# Patient Record
Sex: Female | Born: 1975 | Race: White | Hispanic: No | Smoking: Never smoker
Health system: Southern US, Community
[De-identification: ages and names within clinical notes are randomized; demographics above are authoritative.]

## PROBLEM LIST (undated history)

## (undated) DIAGNOSIS — G43909 Migraine, unspecified, not intractable, without status migrainosus: Secondary | ICD-10-CM

## (undated) DIAGNOSIS — K219 Gastro-esophageal reflux disease without esophagitis: Secondary | ICD-10-CM

## (undated) DIAGNOSIS — N879 Dysplasia of cervix uteri, unspecified: Secondary | ICD-10-CM

## (undated) DIAGNOSIS — F329 Major depressive disorder, single episode, unspecified: Secondary | ICD-10-CM

## (undated) DIAGNOSIS — B977 Papillomavirus as the cause of diseases classified elsewhere: Secondary | ICD-10-CM

## (undated) DIAGNOSIS — E039 Hypothyroidism, unspecified: Secondary | ICD-10-CM

## (undated) DIAGNOSIS — F419 Anxiety disorder, unspecified: Secondary | ICD-10-CM

## (undated) DIAGNOSIS — F32A Depression, unspecified: Secondary | ICD-10-CM

## (undated) DIAGNOSIS — J302 Other seasonal allergic rhinitis: Secondary | ICD-10-CM

## (undated) HISTORY — PX: DILATION AND CURETTAGE OF UTERUS: SHX78

## (undated) HISTORY — PX: ABDOMINAL HYSTERECTOMY: SHX81

## (undated) SURGERY — Surgical Case
Anesthesia: *Unknown

---
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
## 2001-08-01 ENCOUNTER — Other Ambulatory Visit: Admission: RE | Admit: 2001-08-01 | Discharge: 2001-08-01 | Payer: Self-pay | Admitting: *Deleted

## 2002-11-27 ENCOUNTER — Other Ambulatory Visit: Admission: RE | Admit: 2002-11-27 | Discharge: 2002-11-27 | Payer: Self-pay | Admitting: Obstetrics and Gynecology

## 2003-11-19 ENCOUNTER — Ambulatory Visit (HOSPITAL_COMMUNITY): Admission: RE | Admit: 2003-11-19 | Discharge: 2003-11-19 | Payer: Self-pay | Admitting: Obstetrics and Gynecology

## 2003-11-19 ENCOUNTER — Encounter (INDEPENDENT_AMBULATORY_CARE_PROVIDER_SITE_OTHER): Payer: Self-pay | Admitting: *Deleted

## 2004-11-03 ENCOUNTER — Other Ambulatory Visit: Admission: RE | Admit: 2004-11-03 | Discharge: 2004-11-03 | Payer: Self-pay | Admitting: Obstetrics and Gynecology

## 2004-12-09 ENCOUNTER — Emergency Department (HOSPITAL_COMMUNITY): Admission: EM | Admit: 2004-12-09 | Discharge: 2004-12-09 | Payer: Self-pay

## 2005-02-04 ENCOUNTER — Inpatient Hospital Stay (HOSPITAL_COMMUNITY): Admission: AD | Admit: 2005-02-04 | Discharge: 2005-02-04 | Payer: Self-pay | Admitting: Obstetrics and Gynecology

## 2005-02-06 ENCOUNTER — Inpatient Hospital Stay (HOSPITAL_COMMUNITY): Admission: AD | Admit: 2005-02-06 | Discharge: 2005-02-06 | Payer: Self-pay | Admitting: Obstetrics and Gynecology

## 2005-02-17 ENCOUNTER — Inpatient Hospital Stay (HOSPITAL_COMMUNITY): Admission: AD | Admit: 2005-02-17 | Discharge: 2005-02-17 | Payer: Self-pay | Admitting: Obstetrics and Gynecology

## 2005-04-13 ENCOUNTER — Inpatient Hospital Stay (HOSPITAL_COMMUNITY): Admission: AD | Admit: 2005-04-13 | Discharge: 2005-04-13 | Payer: Self-pay | Admitting: Obstetrics and Gynecology

## 2005-04-20 ENCOUNTER — Inpatient Hospital Stay (HOSPITAL_COMMUNITY): Admission: AD | Admit: 2005-04-20 | Discharge: 2005-04-20 | Payer: Self-pay | Admitting: Obstetrics and Gynecology

## 2005-04-22 ENCOUNTER — Inpatient Hospital Stay (HOSPITAL_COMMUNITY): Admission: AD | Admit: 2005-04-22 | Discharge: 2005-04-22 | Payer: Self-pay | Admitting: Obstetrics and Gynecology

## 2005-05-04 ENCOUNTER — Inpatient Hospital Stay (HOSPITAL_COMMUNITY): Admission: AD | Admit: 2005-05-04 | Discharge: 2005-05-04 | Payer: Self-pay | Admitting: Obstetrics and Gynecology

## 2005-05-05 ENCOUNTER — Inpatient Hospital Stay (HOSPITAL_COMMUNITY): Admission: AD | Admit: 2005-05-05 | Discharge: 2005-05-08 | Payer: Self-pay | Admitting: Obstetrics & Gynecology

## 2005-06-21 ENCOUNTER — Other Ambulatory Visit: Admission: RE | Admit: 2005-06-21 | Discharge: 2005-06-21 | Payer: Self-pay | Admitting: Obstetrics and Gynecology

## 2007-01-18 ENCOUNTER — Encounter: Admission: RE | Admit: 2007-01-18 | Discharge: 2007-01-18 | Payer: Self-pay | Admitting: Obstetrics and Gynecology

## 2007-06-22 ENCOUNTER — Ambulatory Visit (HOSPITAL_COMMUNITY): Admission: RE | Admit: 2007-06-22 | Discharge: 2007-06-22 | Payer: Self-pay | Admitting: Internal Medicine

## 2007-06-22 ENCOUNTER — Encounter (HOSPITAL_COMMUNITY): Payer: Self-pay | Admitting: Obstetrics and Gynecology

## 2011-04-27 NOTE — Op Note (Signed)
Virginia Marquez, Virginia Marquez                ACCOUNT NO.:  [id number]   MEDICAL RECORD NO.:  [id number]          PATIENT TYPE:  AMB   LOCATION:  [facility]                          FACILITY:  [facility]   PHYSICIAN:  Zelphia Cairo, MD    DATE OF BIRTH:  04/06/1976   DATE OF PROCEDURE:  06/22/2007  DATE OF DISCHARGE:  06/22/2007                               OPERATIVE REPORT   PREOPERATIVE DIAGNOSIS:  Missed AB   POSTOPERATIVE DIAGNOSIS:  Missed AB.   PROCEDURE:  1. Suction D&E.  2. Cervical block.   ANESTHESIA:  MAC and local.   SPECIMEN:  Products of conception.   ESTIMATED BLOOD LOSS:  Minimal.   COMPLICATIONS:  None.   CONDITION:  Stable and extubated to recovery room.   PROCEDURE:  The patient was taken to the operating room where anesthesia  was adequately obtained. She was placed in the dorsal lithotomy position  using Allen stirrups.  She was prepped and draped in sterile fashion and  an in-and-out catheter was used to drain her bladder.  A bivalve  speculum was then placed in the vagina and a single-tooth tenaculum was  placed on the anterior lip of the cervix.  The cervical block was  performed.  The cervix was gently dilated and a suction catheter was  inserted without difficulty to the fundus.  Suction was turned on and  the contents of the uterus were evacuated.  The curette was then used to  ensure a uterine cry.  Hemostasis was assured.  The single-tooth  tenaculum and speculum were removed from the vagina.  The patient was  taken to the recovery room in stable condition.      Zelphia Cairo, MD  Electronically Signed     GA/MEDQ  D:  07/25/2007  T:  07/26/2007  Job:  [id number]

## 2011-04-30 NOTE — Op Note (Signed)
NAME:  Virginia Marquez, Virginia Marquez                          ACCOUNT NO.:  [id number]   MEDICAL RECORD NO.:  [id number]                   PATIENT TYPE:  AMB   LOCATION:  [facility]                                  [facility]ILITY:  [facility]   PHYSICIAN:  Michelle L. Vincente Poli, M.D.            DATE OF BIRTH:  1976/01/21   DATE OF PROCEDURE:  11/19/2003  DATE OF DISCHARGE:                                 OPERATIVE REPORT   PREOPERATIVE DIAGNOSIS:  Missed abortion.   POSTOPERATIVE DIAGNOSIS:  Missed abortion.   PROCEDURE:  Dilatation and evacuation.   SURGEON:  Michelle L. Vincente Poli, M.D.   ANESTHESIA:  MAC paracervical block.   PATHOLOGY:  Products of conception.   DESCRIPTION OF PROCEDURE:  The patient was taken to the operating room.  She  was then given sedation, placed in the lithotomy position.  She was then  prepped and draped in the usual sterile fashion.  In and out catheter was  used to empty the bladder.  Speculum was inserted into the vagina.  The  cervix was grasped with a tenaculum and a paracervical block was performed  in standard fashion at 5 o'clock.  The uterus sounded to 8 cm and was noted  to be in the midline.  The cervical internal os was gently dilated using  Pratt dilators and #7 suction cannula was inserted without difficulty and  the suction curettage was performed with retrieval of the contents  consistent grossly with products of conception.  The suction curettage was  removed and sharp curette was inserted and all four walls of the uterus was  thoroughly curetted until there was no tissue remaining.  A final suction  curettage was then performed.  A substantial amount of tissue was then sent  to [facility] Blase for chromosome analysis.  All instruments were removed from  the vagina.  There was no vaginal bleeding noted.  The patient was taken to  the recovery room in stable condition.  Sponge, lap and instrument counts  correct x 2.                                               Michelle L.  Vincente Poli, M.D.    Florestine Avers  D:  11/19/2003  T:  11/20/2003  Job:  [id number]

## 2011-04-30 NOTE — H&P (Signed)
NAMEELLAGRACE, YOSHIDA                ACCOUNT NO.:  [id number]   MEDICAL RECORD NO.:  [id number]          PATIENT TYPE:  EMS   LOCATION:  ED                           FACILITY:  [facility]   PHYSICIAN:  Sandria Bales. Ezzard Standing, M.D.  DATE OF BIRTH:  27-Jul-1976   DATE OF ADMISSION:  12/09/2004  DATE OF DISCHARGE:                                HISTORY & PHYSICAL   HISTORY:  This is a 35 year old white female who is [redacted] weeks pregnant.  She  apparently had 1 miscarriage before.  She had an amniocentesis today, has  had some kind of vague, right-sided abdominal pain for about 1 week's time.  At the time of amniocentesis, the ultrasound of her gallbladder was negative  by Dr. Vincente Poli.   I spoke with Dr. Orson Slick.  There is still a question of whether she has  gallbladder disease.  She came to [facility] for ultrasound  evaluation.   ALLERGIES:  The patient is allergic to SULFA.   MEDICATIONS:  Include prenatal vitamins.   PHYSICAL EXAMINATION:  VITAL SIGNS:  Temperature 99, blood pressure 138/76,  pulse 87, respirations 20.  GENERAL:  She is a well-nourished, pleasant white female, alert and  cooperative.  ABDOMEN:  Her abdomen is soft.  She has a full uterus which can be palpated.  She has no tenderness, guarding or rebound, normal bowel sounds.  Really a  totally benign abdomen.   I reviewed her ultrasound with Dr. Molli Posey.  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   IMPRESSION:  Ms. Reader has some right-sided abdominal pain of unclear  etiology, but it does not appear to be surgical.  I do not think she has  gall bladder disease.  She is pregnant and this may have something to do  with her pain.   We referred her back to Dr. Vincente Poli, and she needs no follow up with [place]  unless there are further questions or problems.  Of note, I do not have any  labs on her.  These  were done at Dr. Lynnell Dike office and the patient did not  bring them to the ER.     Davi   DHN/MEDQ  D:  12/09/2004  T:  12/09/2004  Job:  [id number]   cc:   Marcelino Duster L. Vincente Poli, M.D.  [address], [address]  [place] [address]  Fax: [phone]

## 2011-09-28 LAB — CBC
HCT: 33.6 — ABNORMAL LOW
Hemoglobin: 11.5 — ABNORMAL LOW
MCHC: 34.3
MCV: 91.1
Platelets: 234
RBC: 3.69 — ABNORMAL LOW
RDW: 12.4
WBC: 8.2

## 2016-11-22 ENCOUNTER — Ambulatory Visit (INDEPENDENT_AMBULATORY_CARE_PROVIDER_SITE_OTHER): Payer: Commercial Managed Care - HMO

## 2016-11-22 ENCOUNTER — Ambulatory Visit (INDEPENDENT_AMBULATORY_CARE_PROVIDER_SITE_OTHER): Payer: Commercial Managed Care - HMO | Admitting: Orthopaedic Surgery

## 2016-11-22 DIAGNOSIS — M25512 Pain in left shoulder: Secondary | ICD-10-CM | POA: Diagnosis not present

## 2016-11-22 DIAGNOSIS — M25521 Pain in right elbow: Secondary | ICD-10-CM | POA: Diagnosis not present

## 2016-11-22 MED ORDER — DICLOFENAC SODIUM 1 % TD GEL: 2.0000 g | Freq: Four times a day (QID) | TRANSDERMAL | 3 refills | Status: DC

## 2016-11-22 MED ORDER — METHYLPREDNISOLONE 4 MG PO TABS: ORAL_TABLET | ORAL | 0 refills | Status: DC

## 2016-11-22 NOTE — Progress Notes (Signed)
Office Visit Note   Patient: Virginia Marquez           Date of Birth: [birth year]           MRN: [id number] Visit Date: 11/22/2016              Requested by: No referring provider defined for this encounter. PCP: [facility]   Assessment & Plan: Visit Diagnoses:  1. Acute pain of left shoulder   2. Pain in right elbow     Plan: At this point I want her modifying her exercise routine completely. I will send in a steroid taper as well as topical anti-inflammatory to try. My next step would be an injection in both areas of steroid if this does not get better. She'll let us know.  Follow-Up Instructions: Return if symptoms worsen or fail to improve.   Orders:  Orders Placed This Encounter  Procedures  . XR Shoulder Left  . XR Elbow 2 Views Right   Meds ordered this encounter  Medications  . methylPREDNISolone (MEDROL) 4 MG tablet    Sig: Medrol dose pack. Take as instructed    Dispense:  21 tablet    Refill:  0  . diclofenac sodium (VOLTAREN) 1 % GEL    Sig: Apply 2 g topically 4 (four) times daily.    Dispense:  100 g    Refill:  3      Procedures: No procedures performed   Clinical Data: No additional findings.   Subjective: Chief Complaint  Patient presents with  . Left Shoulder - Pain    Patient states shoulder for about 2 months, the elbow for about 4 months. She does a lot of lifting at the gym.shoulder painful to touch or reach. Elbow pretty constant and tender to touch.   . Right Elbow - Pain   She said her main pain is occurred after working out. She does a lot of exercise routines is involves her shoulder on the left side at her elbow on the right side. This is mainly curls but also bench pressing as well. She's tried activity modification and has not helped. She's not tried any anti-inflammatories at all. HPI  Review of Systems She denies any neck pain, headache, chest pain, shortness of breath, fever, chills, nausea,  vomiting.  Objective: Vital Signs: There were no vitals taken for this visit.  Physical Exam She is alert and oriented 3 in no acute distress Ortho Exam Examination of her right elbow shows full range of motion of the elbow. She has pain over the medial epicondyle. She has a negative Tinel's test at the cubital tunnel. Her rotation is also full the elbow. Examination of her left shoulder shows full range of motion of the left shoulder. Her pain is at the acromioclavicular joint and some in the subacromial outlet. Her motion is full. Her pain is mainly with internal rotation and adduction as well as a positive Neer sign. Her rotator cuff strength is 5 out of 5. Specialty Comments:  No specialty comments available.  Imaging: Xr Elbow 2 Views Right  Result Date: 11/22/2016 An AP and lateral of her right elbow show no acute irregularities of the bone or joint. There is no effusion.  Xr Shoulder Left  Result Date: 11/22/2016 3 views of her left shoulder AP outlet and axillary show well located shoulder. 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    PMFS History: There are no  active problems to display for this patient.  No past medical history on file.  No family history on file.  No past surgical history on file. Social History   Occupational History  . Not on file.   Social History Main Topics  . Smoking status: Not on file  . Smokeless tobacco: Not on file  . Alcohol use Not on file  . Drug use: Unknown  . Sexual activity: Not on file

## 2017-03-29 DIAGNOSIS — J309 Allergic rhinitis, unspecified: Secondary | ICD-10-CM | POA: Insufficient documentation

## 2017-03-29 DIAGNOSIS — Z86010 Personal history of colonic polyps: Secondary | ICD-10-CM | POA: Insufficient documentation

## 2017-03-29 DIAGNOSIS — R4589 Other symptoms and signs involving emotional state: Secondary | ICD-10-CM | POA: Insufficient documentation

## 2017-03-29 DIAGNOSIS — K219 Gastro-esophageal reflux disease without esophagitis: Secondary | ICD-10-CM | POA: Insufficient documentation

## 2017-05-24 DIAGNOSIS — E039 Hypothyroidism, unspecified: Secondary | ICD-10-CM | POA: Insufficient documentation

## 2017-08-01 DIAGNOSIS — G44209 Tension-type headache, unspecified, not intractable: Secondary | ICD-10-CM | POA: Insufficient documentation

## 2018-10-02 ENCOUNTER — Encounter (INDEPENDENT_AMBULATORY_CARE_PROVIDER_SITE_OTHER): Payer: Self-pay | Admitting: Orthopaedic Surgery

## 2018-10-02 ENCOUNTER — Ambulatory Visit (INDEPENDENT_AMBULATORY_CARE_PROVIDER_SITE_OTHER): Payer: Self-pay

## 2018-10-02 ENCOUNTER — Ambulatory Visit (INDEPENDENT_AMBULATORY_CARE_PROVIDER_SITE_OTHER): Payer: 59 | Admitting: Orthopaedic Surgery

## 2018-10-02 DIAGNOSIS — M79671 Pain in right foot: Secondary | ICD-10-CM | POA: Diagnosis not present

## 2018-10-02 DIAGNOSIS — M7702 Medial epicondylitis, left elbow: Secondary | ICD-10-CM | POA: Diagnosis not present

## 2018-10-02 MED ORDER — METHYLPREDNISOLONE ACETATE 40 MG/ML IJ SUSP
40.0000 mg | INTRAMUSCULAR | Status: AC | PRN
  Administered 2018-10-02: 40 mg

## 2018-10-02 MED ORDER — LIDOCAINE HCL 1 % IJ SOLN
1.0000 mL | INTRAMUSCULAR | Status: AC | PRN
Start: 2018-10-02 — End: 2018-10-02
  Administered 2018-10-02: 1 mL

## 2018-10-02 NOTE — Progress Notes (Signed)
Office Visit Note   Patient: Virginia Marquez           Date of Birth: December 06, 1976           MRN: [id number] Visit Date: 10/02/2018              Requested by: [facility] [address] [place], [place] [address] PCP: [facility]   Assessment & Plan: Visit Diagnoses:  1. Pain in right foot   2. Medial epicondylitis of left elbow     Plan: I do feel that she benefit from injections in both areas as does she.  She will work on stretching exercises as well.  I explained the rationale risk and benefits behind injections and she agreed to proceed with these.  All question concerns were answered and addressed.  She tolerated the injections well.  Follow-up will be as needed.  However if it gets to where it is been 6 weeks I can always repeat these injections.  She will call and let us know.  She may modify her shoe wear as well.  Follow-Up Instructions: Return if symptoms worsen or fail to improve.   Orders:  Orders Placed This Encounter  Procedures  . Hand/UE Inj  . XR Foot Complete Right   No orders of the defined types were placed in this encounter.     Procedures: Hand/UE Inj: L elbow for medial epicondylitis on 10/02/2018 9:20 AM Medications: 1 mL lidocaine 1 %; 40 mg methylPREDNISolone acetate 40 MG/ML      Clinical Data: No additional findings.   Subjective: Chief Complaint  Patient presents with  . Left Elbow - Pain  The patient comes today with chief complaint of left elbow pain over the medial epicondylar area as well as right foot pain over the sinus tarsus area.  Neither side as she injured acutely.  However the right foot pain came acutely as well as left elbow pain.  She has a lot of physical high impact activities.  She is avidly working out as well.  She denies any numbness tingling in her hand or foot.  She denies any previous injuries to these areas.  She is had epicondylitis but on the right side before.  HPI  Review of  Systems She currently denies any headache, chest pain, shortness of breath, fever, chills, nausea, vomiting.  Objective: Vital Signs: There were no vitals taken for this visit.  Physical Exam She is alert and oriented x3 and in no acute distress Ortho Exam Examination of her left elbow shows pain over the medial epicondyle area of the elbow.  The elbow exam is otherwise entirely normal.  Examination of her right foot shows pain over the sinus tarsus area but otherwise normal exam. Specialty Comments:  No specialty comments available.  Imaging: Xr Foot Complete Right  Result Date: 10/02/2018 3 views of the right foot show no acute findings.    PMFS History: Patient Active Problem List   Diagnosis Date Noted  . Medial epicondylitis of left elbow 10/02/2018   History reviewed. No pertinent past medical history.  History reviewed. No pertinent family history.  History reviewed. No pertinent surgical history. Social History   Occupational History  . Not on file  Tobacco Use  . Smoking status: Not on file  Substance and Sexual Activity  . Alcohol use: Not on file  . Drug use: Not on file  . Sexual activity: Not on file

## 2018-12-26 ENCOUNTER — Encounter (INDEPENDENT_AMBULATORY_CARE_PROVIDER_SITE_OTHER): Payer: Self-pay | Admitting: Orthopaedic Surgery

## 2018-12-26 ENCOUNTER — Ambulatory Visit (INDEPENDENT_AMBULATORY_CARE_PROVIDER_SITE_OTHER): Payer: 59 | Admitting: Orthopaedic Surgery

## 2018-12-26 DIAGNOSIS — M7702 Medial epicondylitis, left elbow: Secondary | ICD-10-CM | POA: Diagnosis not present

## 2018-12-26 MED ORDER — LIDOCAINE HCL 1 % IJ SOLN
1.0000 mL | INTRAMUSCULAR | Status: AC | PRN
  Administered 2018-12-26: 1 mL

## 2018-12-26 MED ORDER — METHYLPREDNISOLONE ACETATE 40 MG/ML IJ SUSP
40.0000 mg | INTRAMUSCULAR | Status: AC | PRN
  Administered 2018-12-26: 40 mg

## 2018-12-26 NOTE — Progress Notes (Signed)
   Office Visit Note   Patient: Virginia Marquez           Date of Birth: 06-08-1976           MRN: [id number] Visit Date: 12/26/2018              Requested by: [facility] [address] [place], [place] [address] PCP: [facility]   Assessment & Plan: Visit Diagnoses:  1. Medial epicondylitis of left elbow     Plan: Her signs and symptoms are consistent with medial epicondylitis of the left elbow.  She is had an injection before and I agree with trying another one today.  She tolerated it well.  All question concerns were answered and addressed.  Follow-up otherwise be as needed.  Follow-Up Instructions: Return if symptoms worsen or fail to improve.   Orders:  Orders Placed This Encounter  Procedures  . Hand/UE Inj   No orders of the defined types were placed in this encounter.     Procedures: Hand/UE Inj: L elbow for medial epicondylitis on 12/26/2018 10:16 AM Medications: 1 mL lidocaine 1 %; 40 mg methylPREDNISolone acetate 40 MG/ML      Clinical Data: No additional findings.   Subjective: Chief Complaint  Patient presents with  . Left Elbow - Follow-up  The patient is someone not seen before for left elbow medial epicondylitis.  This is back in October of last year.  We provided injection around the area there is most tender and she is done well.  She is had a flareup of pain again and she points the medial epicondyles source of her pain.  She denies any numbness and tingling or weakness in her left hand.  She is not sure if there is repetitive activities causing this but she is having the same pain again.  The injection did well for her before so she would like to consider an injection again in the left elbow medial epicondylar area.  She denies any acute change in her medical status and is otherwise a healthy 43 year old female.  HPI  Review of Systems She currently denies any headache, chest pain, shortness of breath, fever, chills,  nausea, vomiting.  Objective: Vital Signs: There were no vitals taken for this visit.  Physical Exam She is alert and orient x3 and in no acute distress Ortho Exam Examination of her left elbow shows pain over the medial epicondyle.  She has a negative Tinel sign at the cubital tunnel.  She has full elbow motion and her left elbow is stable on exam.  Her hand exam is normal. Specialty Comments:  No specialty comments available.  Imaging: No results found.   PMFS History: Patient Active Problem List   Diagnosis Date Noted  . Medial epicondylitis of left elbow 10/02/2018   History reviewed. No pertinent past medical history.  History reviewed. No pertinent family history.  History reviewed. No pertinent surgical history. Social History   Occupational History  . Not on file  Tobacco Use  . Smoking status: Not on file  Substance and Sexual Activity  . Alcohol use: Not on file  . Drug use: Not on file  . Sexual activity: Not on file

## 2019-04-24 ENCOUNTER — Other Ambulatory Visit: Payer: Self-pay

## 2019-04-24 ENCOUNTER — Ambulatory Visit (INDEPENDENT_AMBULATORY_CARE_PROVIDER_SITE_OTHER): Payer: 59 | Admitting: Podiatry

## 2019-04-24 ENCOUNTER — Encounter: Payer: Self-pay | Admitting: Podiatry

## 2019-04-24 ENCOUNTER — Ambulatory Visit (INDEPENDENT_AMBULATORY_CARE_PROVIDER_SITE_OTHER): Payer: 59

## 2019-04-24 VITALS — Temp 96.8°F

## 2019-04-24 DIAGNOSIS — M722 Plantar fascial fibromatosis: Secondary | ICD-10-CM | POA: Diagnosis not present

## 2019-04-24 MED ORDER — MELOXICAM 15 MG PO TABS: 15.0000 mg | ORAL_TABLET | Freq: Every day | ORAL | 3 refills | Status: DC

## 2019-04-24 MED ORDER — METHYLPREDNISOLONE 4 MG PO TBPK: ORAL_TABLET | ORAL | 0 refills | Status: DC

## 2019-04-24 NOTE — Patient Instructions (Signed)

## 2019-04-25 ENCOUNTER — Encounter: Payer: Self-pay | Admitting: Podiatry

## 2019-04-25 NOTE — Progress Notes (Signed)
Subjective:  Patient ID: Virginia Marquez, female    DOB: Nov 30, 1976,  MRN: [id number] HPI Chief Complaint  Patient presents with   Foot Pain    Plantar/medial heel left - aching x few weeks, severity increased last Monday, constant pain whenever weight bearing, tried PF sock and a massage ball-no help   New Patient (Initial Visit)    43 y.o. female presents with the above complaint.   ROS: Denies fever chills nausea vomiting muscle aches pains calf pain back pain chest pain shortness of breath.  No past medical history on file. No past surgical history on file.  Current Outpatient Medications:    cetirizine (ZYRTEC) 10 MG tablet, Take 10 mg by mouth daily., Disp: , Rfl:    citalopram (CELEXA) 20 MG tablet, TAKE 1 TABLET(S) EVERY DAY BY ORAL ROUTE., Disp: , Rfl: 11   levonorgestrel (MIRENA) 20 MCG/24HR IUD, by Intrauterine route., Disp: , Rfl:    levothyroxine (SYNTHROID) 25 MCG tablet, Take 25 mcg by mouth daily., Disp: , Rfl:    meloxicam (MOBIC) 15 MG tablet, Take 1 tablet (15 mg total) by mouth daily., Disp: 30 tablet, Rfl: 3   methylPREDNISolone (MEDROL DOSEPAK) 4 MG TBPK tablet, 6 day dose pack - take as directed, Disp: 21 tablet, Rfl: 0   pantoprazole (PROTONIX) 40 MG tablet, TAKE 1 TABLET(S) EVERY DAY BY ORAL ROUTE., Disp: , Rfl: 11  Allergies  Allergen Reactions   Sulfa Antibiotics Other (See Comments)   Review of Systems Objective:   Vitals:   04/24/19 1445  Temp: (!) 96.8 F (36 C)    General: Well developed, nourished, in no acute distress, alert and oriented x3   Dermatological: Skin is warm, dry and supple bilateral. Nails x 10 are well maintained; remaining integument appears unremarkable at this time. There are no open sores, no preulcerative lesions, no rash or signs of infection present.  Vascular: Dorsalis Pedis artery and Posterior Tibial artery pedal pulses are 2/4 bilateral with immedate capillary fill time. Pedal hair growth present. No  varicosities and no lower extremity edema present bilateral.   Neruologic: Grossly intact via light touch bilateral. Vibratory intact via tuning fork bilateral. Protective threshold with Semmes Wienstein monofilament intact to all pedal sites bilateral. Patellar and Achilles deep tendon reflexes 2+ bilateral. No Babinski or clonus noted bilateral.   Musculoskeletal: No gross boney pedal deformities bilateral. No pain, crepitus, or limitation noted with foot and ankle range of motion bilateral. Muscular strength 5/5 in all groups tested bilateral.  She has pain on direct palpation medial calcaneal tubercle of her left heel no pain on medial and lateral compression of the calcaneus.  Gait: Unassisted, Nonantalgic.    Radiographs:  Radiographs taken today demonstrates soft tissue swelling along the medial heel at the plantar fascial calcaneal insertion site.  Small plantar distally oriented calcaneal heel spurs noted no other acute findings are noted.  Assessment & Plan:   Assessment: Plantar fasciitis.  Left.  Plan: Discussed etiology pathology conservative versus surgical therapies.  At this point I injected her left heel with the 20 mg of Kenalog 5 mg Marcaine after sterile Betadine skin prep medial aspect of the heel at the plantar fashion calcaneal insertion site which she tolerated well.  Also started on a Medrol Dosepak to be followed by meloxicam placed in a plantar fascial brace to be followed by a night splint.  We discussed appropriate shoe gear stretching exercises ice therapy and shoe gear modifications and reduction in overall general activities.  I would like to follow-up with her in 1 month for reevaluation.  May need to consider orthotics for her simply because she is started to develop some tenderness along the medial ankle and orthotics may be necessary for her high activity level.     Virginia Marquez, [place]

## 2019-05-18 ENCOUNTER — Other Ambulatory Visit: Payer: Self-pay

## 2019-05-21 ENCOUNTER — Ambulatory Visit: Payer: 59 | Admitting: Obstetrics & Gynecology

## 2019-05-21 ENCOUNTER — Encounter: Payer: Self-pay | Admitting: Obstetrics & Gynecology

## 2019-05-21 ENCOUNTER — Other Ambulatory Visit: Payer: Self-pay

## 2019-05-21 VITALS — BP 118/74 | Ht 68.0 in | Wt 181.0 lb

## 2019-05-21 DIAGNOSIS — Z30431 Encounter for routine checking of intrauterine contraceptive device: Secondary | ICD-10-CM

## 2019-05-21 DIAGNOSIS — Z7251 High risk heterosexual behavior: Secondary | ICD-10-CM | POA: Diagnosis not present

## 2019-05-21 DIAGNOSIS — Z113 Encounter for screening for infections with a predominantly sexual mode of transmission: Secondary | ICD-10-CM | POA: Diagnosis not present

## 2019-05-21 DIAGNOSIS — R300 Dysuria: Secondary | ICD-10-CM

## 2019-05-21 LAB — PREGNANCY, URINE: Preg Test, Ur: NEGATIVE

## 2019-05-21 LAB — WET PREP FOR TRICH, YEAST, CLUE

## 2019-05-21 NOTE — Progress Notes (Signed)
    Virginia Marquez [birth year] [id number]        43 y.o.  [id number]  Single  RP: Pelvic pain/pressure with dysuria x about 3 weeks  HPI: On Mirena IUD x 2016.  IC 5/9th with new partner, random.  Sxs started after that.  Tends to have UTIs after IC, so treated with Cipro and Furantoin.  Still having dysuria and low back pain.  No increased vaginal discharge.  No BTB.  No fever.   OB History  Gravida Para Term Preterm AB Living  4 2     2 2   SAB TAB Ectopic Multiple Live Births  2            # Outcome Date GA Lbr Len/2nd Weight Sex Delivery Anes PTL Lv  4 SAB           3 SAB           2 Para           1 Para             Past medical history,surgical history, problem list, medications, allergies, family history and social history were all reviewed and documented in the EPIC chart.   Directed ROS with pertinent positives and negatives documented in the history of present illness/assessment and plan.  Exam:  Vitals:   05/21/19 1218  BP: 118/74  Weight: 181 lb (82.1 kg)  Height: 5\' 8"  (1.727 m)   General appearance:  Normal  Abdomen: Normal  CVAT negative bilaterally  Gynecologic exam: Vulva normal.  Speculum:  Cervix normal, IUD strings visible at EO.  Vagina normal.  Normal vaginal secretions.  Bimanual exam:  Uterus AV, mobile, NT, normal size.  No adnexal mass felt, NT.  U/A completely negative  Wet prep negative  UPT negative   Assessment/Plan:  43 y.o. [id number]   1. Dysuria Urine analysis completely negative.  Patient reassured. - Urinalysis,Complete w/RFL Culture  2. Unprotected sexual intercourse Strict condom use strongly recommended.  Urine pregnancy test negative.  Pending STI screening. - Pregnancy, urine  3. Screen for STD (sexually transmitted disease) Strict condom use strongly recommended. - HIV antibody (with reflex) - RPR - Hepatitis C Antibody - Hepatitis B Surface AntiGEN - C. trachomatis/N. gonorrhoeae RNA - WET PREP FOR TRICH, YEAST,  CLUE  4. Encounter for routine checking of intrauterine contraceptive device (IUD) Mirena IUD inserted in 2016.  Well-tolerated and in good position.  Counseling on above issues and coordination of care more than 50% for 30 minutes.  Princess Bruins MD, 12:37 PM 05/21/2019

## 2019-05-21 NOTE — Patient Instructions (Signed)
  1. Dysuria Urine analysis completely negative.  Patient reassured. - Urinalysis,Complete w/RFL Culture  2. Unprotected sexual intercourse Strict condom use strongly recommended.  Urine pregnancy test negative.  Pending STI screening. - Pregnancy, urine  3. Screen for STD (sexually transmitted disease) Strict condom use strongly recommended. - HIV antibody (with reflex) - RPR - Hepatitis C Antibody - Hepatitis B Surface AntiGEN - C. trachomatis/N. gonorrhoeae RNA - WET PREP FOR TRICH, YEAST, CLUE  4. Encounter for routine checking of intrauterine contraceptive device (IUD) Mirena IUD inserted in 2016.  Well-tolerated and in good position.  Virginia Marquez, it was a pleasure meeting you today!  I will inform you of your results as soon as they are available.

## 2019-05-22 ENCOUNTER — Ambulatory Visit: Payer: 59 | Admitting: Podiatry

## 2019-05-22 LAB — URINALYSIS, COMPLETE W/RFL CULTURE
Bacteria, UA: NONE SEEN /HPF
Bilirubin Urine: NEGATIVE
Glucose, UA: NEGATIVE
Hgb urine dipstick: NEGATIVE
Hyaline Cast: NONE SEEN /LPF
Ketones, ur: NEGATIVE
Leukocyte Esterase: NEGATIVE
Nitrites, Initial: NEGATIVE
Protein, ur: NEGATIVE
RBC / HPF: NONE SEEN /HPF (ref 0–2)
Specific Gravity, Urine: 1.002 (ref 1.001–1.03)
WBC, UA: NONE SEEN /HPF (ref 0–5)
pH: 5.5 (ref 5.0–8.0)

## 2019-05-22 LAB — HEPATITIS B SURFACE ANTIGEN: Hepatitis B Surface Ag: NONREACTIVE

## 2019-05-22 LAB — RPR: RPR Ser Ql: NONREACTIVE

## 2019-05-22 LAB — C. TRACHOMATIS/N. GONORRHOEAE RNA
C. trachomatis RNA, TMA: NOT DETECTED
N. gonorrhoeae RNA, TMA: NOT DETECTED

## 2019-05-22 LAB — HIV ANTIBODY (ROUTINE TESTING W REFLEX): HIV 1&2 Ab, 4th Generation: NONREACTIVE

## 2019-05-22 LAB — HEPATITIS C ANTIBODY
Hepatitis C Ab: NONREACTIVE
SIGNAL TO CUT-OFF: 0.02 (ref <1.00)

## 2019-05-22 LAB — NO CULTURE INDICATED

## 2019-07-30 ENCOUNTER — Other Ambulatory Visit: Payer: Self-pay

## 2019-07-31 ENCOUNTER — Ambulatory Visit (INDEPENDENT_AMBULATORY_CARE_PROVIDER_SITE_OTHER): Payer: 59 | Admitting: Obstetrics & Gynecology

## 2019-07-31 ENCOUNTER — Encounter: Payer: Self-pay | Admitting: Obstetrics & Gynecology

## 2019-07-31 VITALS — BP 126/78 | Ht 68.0 in | Wt 184.0 lb

## 2019-07-31 DIAGNOSIS — Z30431 Encounter for routine checking of intrauterine contraceptive device: Secondary | ICD-10-CM

## 2019-07-31 DIAGNOSIS — Z01419 Encounter for gynecological examination (general) (routine) without abnormal findings: Secondary | ICD-10-CM

## 2019-07-31 NOTE — Progress Notes (Signed)
Virginia Marquez December 02, 1976 [id number]   History:    43 y.o. G4P2A2L2 Single.  Son is 39 and daughter is 10+++.  RP:  Established patient presenting for annual gyn exam   HPI: Well on Mirena IUD x 2016, will verify the exact month of insertion.  Light occasional menses.  No pelvic pain.  Full STI screen negative 05/2019.  Not currently sexually active. Urine/BMs normal.  Breasts normal.  BMI 27.98.  Good fitness and healthy nutrition.  Health labs with [facility]  Past medical history,surgical history, family history and social history were all reviewed and documented in the EPIC chart.  Gynecologic History No LMP recorded. (Menstrual status: IUD). Contraception: Mirena IUD x 2016 Last Pap: 2019. Results were: normal Last mammogram: 2019. Results were: normal Bone Density: Never Colonoscopy: Never  Obstetric History OB History  Gravida Para Term Preterm AB Living  4 2     2 2   SAB TAB Ectopic Multiple Live Births  2            # Outcome Date GA Lbr Len/2nd Weight Sex Delivery Anes PTL Lv  4 SAB           3 SAB           2 Para           1 Para              ROS: A ROS was performed and pertinent positives and negatives are included in the history.  GENERAL: No fevers or chills. HEENT: No change in vision, no earache, sore throat or sinus congestion. NECK: No pain or stiffness. CARDIOVASCULAR: No chest pain or pressure. No palpitations. PULMONARY: No shortness of breath, cough or wheeze. GASTROINTESTINAL: No abdominal pain, nausea, vomiting or diarrhea, melena or bright red blood per rectum. GENITOURINARY: No urinary frequency, urgency, hesitancy or dysuria. MUSCULOSKELETAL: No joint or muscle pain, no back pain, no recent trauma. DERMATOLOGIC: No rash, no itching, no lesions. ENDOCRINE: No polyuria, polydipsia, no heat or cold intolerance. No recent change in weight. HEMATOLOGICAL: No anemia or easy bruising or bleeding. NEUROLOGIC: No headache, seizures, numbness, tingling or  weakness. PSYCHIATRIC: No depression, no loss of interest in normal activity or change in sleep pattern.     Exam:   BP 126/78   Ht 5\' 8"  (1.727 m)   Wt 184 lb (83.5 kg)   BMI 27.98 kg/m   Body mass index is 27.98 kg/m.  General appearance : Well developed well nourished female. No acute distress HEENT: Eyes: no retinal hemorrhage or exudates,  Neck supple, trachea midline, no carotid bruits, no thyroidmegaly Lungs: Clear to auscultation, no rhonchi or wheezes, or rib retractions  Heart: Regular rate and rhythm, no murmurs or gallops Breast:Examined in sitting and supine position were symmetrical in appearance, no palpable masses or tenderness,  no skin retraction, no nipple inversion, no nipple discharge, no skin discoloration, no axillary or supraclavicular lymphadenopathy Abdomen: no palpable masses or tenderness, no rebound or guarding Extremities: no edema or skin discoloration or tenderness  Pelvic: Vulva: Normal             Vagina: No gross lesions or discharge  Cervix: No gross lesions or discharge.  IUD strings visible.  Pap/HPV HR done.  Uterus  AV, normal size, shape and consistency, non-tender and mobile  Adnexa  Without masses or tenderness  Anus: Normal   Assessment/Plan:  43 y.o. female for annual exam   1. Encounter for routine gynecological  examination with Papanicolaou smear of cervix Normal gynecologic exam.  Pap with high-risk HPV done today.  Breast exam normal.  Screening mammogram 2019 was normal per patient.  Will schedule a screening mammogram now.  Health labs with family physician.  Body mass index 27.98.  Recommend a slightly lower calorie/carb diet such as [employer] [employer] diet.  Aerobic physical activities 5 times a week and weightlifting every 2 days.  2. Encounter for routine checking of intrauterine contraceptive device (IUD) Mirena IUD well-tolerated and in good position.  Year of insertion was 2016.  Patient will verify the exact insertion date.  We  will follow-up at 5 years for IUD removal and new IUD insertion.  Genia DelMarie-Lyne Mariena Meares MD, 9:33 AM 07/31/2019

## 2019-08-02 ENCOUNTER — Other Ambulatory Visit: Payer: Self-pay | Admitting: Podiatry

## 2019-08-02 LAB — PAP, TP IMAGING W/ HPV RNA, RFLX HPV TYPE 16,18/45: HPV DNA High Risk: DETECTED — AB

## 2019-08-05 ENCOUNTER — Encounter: Payer: Self-pay | Admitting: Obstetrics & Gynecology

## 2019-08-05 NOTE — Patient Instructions (Signed)
  1. Encounter for routine gynecological examination with Papanicolaou smear of cervix Normal gynecologic exam.  Pap with high-risk HPV done today.  Breast exam normal.  Screening mammogram 2019 was normal per patient.  Will schedule a screening mammogram now.  Health labs with family physician.  Body mass index 27.98.  Recommend a slightly lower calorie/carb diet such as [employer].  Aerobic physical activities 5 times a week and weightlifting every 2 days.  2. Encounter for routine checking of intrauterine contraceptive device (IUD) Mirena IUD well-tolerated and in good position.  Year of insertion was 2016.  Patient will verify the exact insertion date.  We will follow-up at 5 years for IUD removal and new IUD insertion.  Virginia Marquez, it was a pleasure seeing you today!  I will inform you of your results as soon as they are available.

## 2019-08-09 ENCOUNTER — Other Ambulatory Visit: Payer: Self-pay | Admitting: Obstetrics & Gynecology

## 2019-08-09 DIAGNOSIS — Z1231 Encounter for screening mammogram for malignant neoplasm of breast: Secondary | ICD-10-CM

## 2019-08-28 ENCOUNTER — Other Ambulatory Visit: Payer: Self-pay

## 2019-08-29 ENCOUNTER — Ambulatory Visit: Payer: 59 | Admitting: Obstetrics & Gynecology

## 2019-08-29 ENCOUNTER — Encounter: Payer: Self-pay | Admitting: Obstetrics & Gynecology

## 2019-08-29 VITALS — BP 120/72

## 2019-08-29 DIAGNOSIS — Z30431 Encounter for routine checking of intrauterine contraceptive device: Secondary | ICD-10-CM

## 2019-08-29 DIAGNOSIS — R87810 Cervical high risk human papillomavirus (HPV) DNA test positive: Secondary | ICD-10-CM

## 2019-08-29 DIAGNOSIS — N871 Moderate cervical dysplasia: Secondary | ICD-10-CM

## 2019-08-29 DIAGNOSIS — R87612 Low grade squamous intraepithelial lesion on cytologic smear of cervix (LGSIL): Secondary | ICD-10-CM

## 2019-08-29 NOTE — Patient Instructions (Signed)
1. LGSIL on Pap smear of cervix Colposcopy procedure reviewed with patient.  Colposcopy findings explained.  Management per results of HPV 16-18-45 and cervical biopsies.  Post colposcopy precautions discussed.  2. Cervical high risk HPV (human papillomavirus) test positive HPV 16-18-45 done.  3. Encounter for routine checking of intrauterine contraceptive device (IUD) IUD in good position, strings visible.  Other orders - Pathology Report ([facility]) - HPV Type 16 and 18/45 RNA  Virginia Marquez, it was a pleasure seeing you today!  I will inform you of your results as soon as they are available.

## 2019-08-29 NOTE — Progress Notes (Signed)
    Virginia Marquez 02-19-1976 [id number]        43 y.o.  [id number] Divorced.    RP: LGSIL/HPV HR positive 07/31/2019  HPI: Mirena IUD x 08/2015.  Had an abnormal Pap/Colpo around 2016, no treatment needed.  Paps normal since then, until this one in 07/2019.  Full STI screen negative 05/2019.   OB History  Gravida Para Term Preterm AB Living  4 2     2 2   SAB TAB Ectopic Multiple Live Births  2            # Outcome Date GA Lbr Len/2nd Weight Sex Delivery Anes PTL Lv  4 SAB           3 SAB           2 Para           1 Para             Past medical history,surgical history, problem list, medications, allergies, family history and social history were all reviewed and documented in the EPIC chart.   Directed ROS with pertinent positives and negatives documented in the history of present illness/assessment and plan.  Exam:  Vitals:   08/29/19 0936  BP: 120/72   General appearance:  Normal  Colposcopy Procedure Note Virginia Marquez 08/29/2019  Indications: LGSIL/HPV HR positive  Procedure Details  The risks and benefits of the procedure and Verbal informed consent obtained.  Speculum placed in vagina and excellent visualization of cervix achieved, cervix swabbed x 3 with acetic acid solution.  Findings:  Cervix colposcopy: Physical Exam Genitourinary:       Vaginal colposcopy: Normal  Vulvar colposcopy: Normal  Perirectal colposcopy: Grossly normal  The cervix was sprayed with Hurricane before performing the cervical biopsies.  Specimens:  HPV 16-18-45.  Cervical Bxs at 4 O'Clock, 9 O'Clock, 12 O'Clock.  Complications:  None, good hemostasis with Silver Nitrate . Plan:  Management per results of HPV and Cervical Bxs.   Assessment/Plan:  43 y.o. [id number]   1. LGSIL on Pap smear of cervix Colposcopy procedure reviewed with patient.  Colposcopy findings explained.  Management per results of HPV 16-18-45 and cervical biopsies.  Post colposcopy precautions  discussed.  2. Cervical high risk HPV (human papillomavirus) test positive HPV 16-18-45 done.  3. Encounter for routine checking of intrauterine contraceptive device (IUD) IUD in good position, strings visible.  Other orders - Pathology Report ([facility]) - HPV Type 16 and 18/45 RNA  Counseling on above issues and coordination of care more than 50% for 10 minutes.  Virginia Bruins MD, 9:41 AM 08/29/2019

## 2019-08-31 LAB — HPV TYPE 16 AND 18/45 RNA
HPV Type 16 RNA: DETECTED — AB
HPV Type 18/45 RNA: NOT DETECTED

## 2019-09-04 LAB — TISSUE PATH REPORT 10802

## 2019-09-04 LAB — PATHOLOGY REPORT

## 2019-09-05 ENCOUNTER — Telehealth: Payer: Self-pay

## 2019-09-05 NOTE — Telephone Encounter (Signed)
I spoke with patient yesterday about Pap result and your recommendation for LEEP and appt scheduled.  She called back today to ask if you would consider hysterectomy?

## 2019-09-07 NOTE — Telephone Encounter (Signed)
Schedule visit to discuss management.

## 2019-09-07 NOTE — Telephone Encounter (Signed)
Patient advised. Appt scheduled. 

## 2019-09-17 ENCOUNTER — Other Ambulatory Visit: Payer: Self-pay

## 2019-09-18 ENCOUNTER — Ambulatory Visit: Payer: 59 | Admitting: Obstetrics & Gynecology

## 2019-09-18 ENCOUNTER — Encounter: Payer: Self-pay | Admitting: Obstetrics & Gynecology

## 2019-09-18 ENCOUNTER — Telehealth: Payer: Self-pay

## 2019-09-18 VITALS — BP 118/76

## 2019-09-18 DIAGNOSIS — N871 Moderate cervical dysplasia: Secondary | ICD-10-CM

## 2019-09-18 DIAGNOSIS — R87810 Cervical high risk human papillomavirus (HPV) DNA test positive: Secondary | ICD-10-CM | POA: Diagnosis not present

## 2019-09-18 DIAGNOSIS — D069 Carcinoma in situ of cervix, unspecified: Secondary | ICD-10-CM

## 2019-09-18 NOTE — Progress Notes (Signed)
    Virginia Marquez October 22, 1976 [id number]        43 y.o.  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  No desire to preserve fertility.  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     OB History  Gravida Para Term Preterm AB Living  4 2     2 2   SAB TAB Ectopic Multiple Live Births  2            # Outcome Date GA Lbr Len/2nd Weight Sex Delivery Anes PTL Lv  4 SAB           3 SAB           2 Para           1 Para             Past medical history,surgical history, problem list, medications, allergies, family history and social history were all reviewed and documented in the EPIC chart.   Directed ROS with pertinent positives and negatives documented in the history of present illness/assessment and plan.  Exam:  Vitals:   09/18/19 0927  BP: 118/76   General appearance:  Normal  Gynecologic exam normal AV uterus/no adnexal mass on 07/31/2019.   Assessment/Plan:  43 y.o. [id number]   1. 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  Counseling on management of severe cervical dysplasia reviewed.  After counseling on LEEP and Total Hysterectomy, given severe dysplasia/HPV 16 pos and no desire to conceive, patient prefers Total Hysterectomy.  Decision to proceed with LAVH/Bilateral Salpingectomy.  Surgery and risks thoroughly reviewed.  The risk of still having vaginal cells/vulvar cells infected with HPV 16 discussed.  Preop preparation and postop precautions discussed.    2. 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 Infection (prohylactic antibiotics will be administered)  2. DVT/Pulmonary Embolism (prophylactic pneumo compression stockings will be used)  3.Trauma to internal organs requiring additional  surgical procedure to repair any injury to internal organs requiring perhaps additional hospitalization days.  4.Hemmorhage requiring transfusion and blood products which carry risks such as anaphylactic reaction, hepatitis and AIDS  Patient had received literature information on the procedure scheduled and all her questions were answered and fully accepts all risk.   Counseling on above issues and coordination on care >50% x 25 minutes  Princess Bruins MD, 9:58 AM 09/18/2019

## 2019-09-18 NOTE — Patient Instructions (Signed)
1. 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  Counseling on management of severe cervical dysplasia reviewed.  After counseling on LEEP and Total Hysterectomy, given severe dysplasia/HPV 16 pos and no desire to conceive, patient prefers Total Hysterectomy.  Decision to proceed with LAVH/Bilateral Salpingectomy.  Surgery and risks thoroughly reviewed.  The risk of still having vaginal cells/vulvar cells infected with HPV 16 discussed.  Preop preparation and postop precautions discussed.    2. 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 Infection (prohylactic antibiotics will be administered)  2. DVT/Pulmonary Embolism (prophylactic pneumo compression stockings will be used)  3.Trauma to internal organs requiring additional surgical procedure to repair any injury to internal organs requiring perhaps additional hospitalization days.  4.Hemmorhage requiring transfusion and blood products which carry risks such as anaphylactic reaction, hepatitis and AIDS  Patient had received literature information on the procedure scheduled and all her questions were answered and fully accepts all risk.  Meliza, it was a pleasure seeing you today!    Laparoscopically Assisted Vaginal Hysterectomy A laparoscopically assisted vaginal hysterectomy (LAVH) is a surgical procedure to remove the uterus and cervix. Sometimes, the ovaries and fallopian tubes are also removed. This surgery may be done to treat problems such as:  Noncancerous growths in the uterus (uterine fibroids) that cause symptoms.  A condition that causes the lining of the uterus to grow in other areas (endometriosis).  Problems with pelvic support.  Cancer of the cervix, ovaries, uterus, or tissue that lines the uterus (endometrium).  Excessive  (dysfunctional) uterine bleeding. During an LAVH, some of the surgical removal is done through the vagina, and the rest is done through a few small incisions in the abdomen. This technique may be an option for women who are not able to have a vaginal hysterectomy. Tell a health care provider about:  Any allergies you have.  All medicines you are taking, including vitamins, herbs, eye drops, creams, and over-the-counter medicines.  Any problems you or family members have had with anesthetic medicines.  Any blood disorders you have.  Any surgeries you have had.  Any medical conditions you have.  Whether you are pregnant or may be pregnant. What are the risks? Generally, this is a safe procedure. However, problems may occur, including:  Infection.  Bleeding.  Allergic reactions to medicines.  Damage to other structures or organs.  Difficulty breathing. What happens before the procedure? Staying hydrated Follow instructions from your health care provider about hydration, which may include:  Up to 2 hours before the procedure - you may continue to drink clear liquids, such as water, clear fruit juice, black coffee, and plain tea. Eating and drinking restrictions Follow instructions from your health care provider about eating and drinking, which may include:  8 hours before the procedure - stop eating heavy meals or foods such as meat, fried foods, or fatty foods.  6 hours before the procedure - stop eating light meals or foods, such as toast or cereal.  6 hours before the procedure - stop drinking milk or drinks that contain milk.  2 hours before the procedure - stop drinking clear liquids. Medicines  Ask your health care provider about: ? Changing or stopping your regular medicines. This is  especially important if you are taking diabetes medicines or blood thinners. ? Taking over-the-counter medicines, vitamins, herbs, and supplements. ? Taking medicines such as aspirin  and ibuprofen. These medicines can thin your blood. Do not take these medicines unless your health care provider tells you to take them.  You may be asked to take a medicine to empty your colon (bowel preparation).  You may be given antibiotic medicine to help prevent infection. General instructions  Plan to have someone take you home from the hospital or clinic.  Ask your health care provider how your surgical site will be marked or identified.  You may be asked to shower with a germ-killing soap.  Do not use any products that contain nicotine or tobacco, such as cigarettes and e-cigarettes. These can delay healing after surgery. If you need help quitting, ask your health care provider. What happens during the procedure?  To lower your risk of infection: ? Your health care team will wash or sanitize their hands. ? Hair may be removed from the surgical area. ? Your skin will be washed with soap.  An IV will be inserted into one of your veins.  You will be given one or more of the following: ? A medicine to help you relax (sedative). ? A medicine to make you fall asleep (general anesthetic).  You may have a flexible tube (catheter) put into your bladder to drain urine.  You may have a tube put through your nose or mouth down into your stomach (nasogastric tube). The nasogastric tube will remove digestive fluids and prevent nausea and vomiting.  Tight-fitting (compression) stockings will be placed on your legs to promote circulation.  Three or four small incisions will be made in your abdomen. An incision will also be made in your vagina.  Probes and tools will be inserted into the small incisions. The uterus and cervix (and possibly the ovaries and fallopian tubes) will be removed through your vagina as well as through the small incisions that were made in the abdomen.  The incisions will then be closed with stitches (sutures). The procedure may vary among health care providers  and hospitals. What happens after the procedure?  Your blood pressure, heart rate, breathing rate, and blood oxygen level will be monitored until the medicines you were given have worn off.  You may have a liquid diet at first. You will most likely return to your usual diet the day after surgery.  You will still have the urinary catheter in place. It will likely be removed the day after surgery.  You may have to wear compression stockings. These stockings help to prevent blood clots and reduce swelling in your legs.  You will be encouraged to walk as soon as possible. You will also use a device or do breathing exercises to keep your lungs clear.  Do not drive for 24 hours if you were given a sedative. Summary  A laparoscopically assisted vaginal hysterectomy (LAVH) is a surgical procedure to remove the uterus and cervix, and sometimes the ovaries and fallopian tubes.  Follow instructions from your health care provider about eating and drinking before the procedure.  During an LAVH, some of the surgical removal is done through the vagina, and the rest is done through a few small incisions in the abdomen. This information is not intended to replace advice given to you by your health care provider. Make sure you discuss any questions you have with your health care provider. Document Released: 11/18/2011 Document Revised:  01/22/2019 Document Reviewed: 02/24/2017 Elsevier Patient Education  2020 [employer].

## 2019-09-18 NOTE — Telephone Encounter (Signed)
I called patient to discuss scheduling surgery. We reviewed her ins benefits and her est surgery prepymt due by one week prior to surgery.  I will mail financial letter. Surgery was scheduled for 10/09/19 at 10:30am at [facility].  Patient has already had pre op visit per Dr. Marguerita Merles.  I discussed with patient the need for Covid screen 4 days post surgery and appt was scheduled accordingly.  I advised her she will have to quarantine from time of test until surgery only going out for medical visits/medical emergencies.. I will mail her a [facility] pamphlet as well as a Covid sheet.

## 2019-09-24 ENCOUNTER — Encounter: Payer: Self-pay | Admitting: Anesthesiology

## 2019-10-01 ENCOUNTER — Encounter (HOSPITAL_COMMUNITY): Payer: Self-pay

## 2019-10-01 NOTE — Patient Instructions (Addendum)
DUE TO COVID-19 ONLY ONE VISITOR IS ALLOWED IN WAITING ROOM (VISITOR WILL HAVE A TEMPERATURE CHECK ON ARRIVAL AND MUST WEAR A FACE MASK THE ENTIRE TIME.)  ONCE YOU ARE ADMITTED TO YOUR PRIVATE ROOM, THE SAME ONE VISITOR IS ALLOWED TO VISIT DURING VISITING HOURS ONLY.  Your COVID swab testing is scheduled for Friday, Oct. 23, 2020  At 3:10PM  , You must self quarantine after your testing per handout given to you at the testing site.  ([address], [place] [facility] Drive up testing enter pre-surgical testing line)    Your procedure is scheduled on: Tuesday, Oct. 27, 2020  Report to [facility] AT  8:00 A. M.   Call this number if you have problems the morning of surgery:  [phone].   OUR ADDRESS IS [address].  WE ARE LOCATED [place]                                   [place].                                     REMEMBER:  DO NOT EAT FOOD OR DRINK LIQUIDS AFTER MIDNIGHT .    BRUSH YOUR TEETH THE MORNING OF SURGERY.  TAKE THESE MEDICATIONS MORNING OF SURGERY WITH A SIP OF WATER:  Levothyroxine  May use Flonase if needed day of surgery  DO NOT WEAR JEWERLY, MAKE UP, OR NAIL POLISH.  DO NOT WEAR LOTIONS, POWDERS, PERFUMES/COLOGNE OR DEODORANT.  DO NOT SHAVE FOR 24 HOURS PRIOR TO DAY OF SURGERY.  CONTACTS, GLASSES, OR DENTURES MAY NOT BE WORN TO SURGERY.                                    [place] IS NOT RESPONSIBLE  FOR ANY BELONGINGS.        BRING ALL PRESCRIPTION MEDICATIONS WITH YOU THE DAY OF SURGERY IN ORIGINAL CONTAINERS                                                              .  - Preparing for Surgery Before surgery, you can play an important role.  Because skin is not sterile, your skin needs to be as free of germs as possible.  You can reduce the number of germs on your skin by washing with CHG (chlorahexidine gluconate) soap before surgery.  CHG is an antiseptic cleaner which kills  germs and bonds with the skin to continue killing germs even after washing. Please DO NOT use if you have an allergy to CHG or antibacterial soaps.  If your skin becomes reddened/irritated stop using the CHG and inform your nurse when you arrive at Short Stay. Do not shave (including legs and underarms) for at least 48 hours prior to the first CHG shower.  You may shave your face/neck.  Please follow these instructions carefully:  1.  Shower with CHG Soap the night before surgery and the  morning of surgery.  2.  If you choose to  wash your hair, wash your hair first as usual with your normal  shampoo.  3.  After you shampoo, rinse your hair and body thoroughly to remove the shampoo.                             4.  Use CHG as you would any other liquid soap.  You can apply chg directly to the skin and wash.  Gently with a scrungie or clean washcloth.  5.  Apply the CHG Soap to your body ONLY FROM THE NECK DOWN.   Do   not use on face/ open                           Wound or open sores. Avoid contact with eyes, ears mouth and   genitals (private parts).                       Wash face,  Genitals (private parts) with your normal soap.             6.  Wash thoroughly, paying special attention to the area where your    surgery  will be performed.  7.  Thoroughly rinse your body with warm water from the neck down.  8.  DO NOT shower/wash with your normal soap after using and rinsing off the CHG Soap.                9.  Pat yourself dry with a clean towel.            10.  Wear clean pajamas.            11.  Place clean sheets on your bed the night of your first shower and do not  sleep with pets. Day of Surgery : Do not apply any lotions/deodorants the morning of surgery.  Please wear clean clothes to the hospital/surgery center.  FAILURE TO FOLLOW THESE INSTRUCTIONS MAY RESULT IN THE CANCELLATION OF YOUR SURGERY  PATIENT SIGNATURE_________________________________  NURSE  SIGNATURE__________________________________  ________________________________________________________________________

## 2019-10-02 ENCOUNTER — Encounter (HOSPITAL_COMMUNITY)
Admission: RE | Admit: 2019-10-02 | Discharge: 2019-10-02 | Disposition: A | Discharge status: Home / Self Care | Payer: 59 | Source: Ambulatory Visit | Attending: Obstetrics & Gynecology | Admitting: Obstetrics & Gynecology

## 2019-10-02 ENCOUNTER — Encounter (HOSPITAL_COMMUNITY): Payer: Self-pay

## 2019-10-02 ENCOUNTER — Ambulatory Visit: Payer: 59 | Admitting: Obstetrics & Gynecology

## 2019-10-02 ENCOUNTER — Other Ambulatory Visit: Payer: Self-pay

## 2019-10-02 DIAGNOSIS — Z01812 Encounter for preprocedural laboratory examination: Secondary | ICD-10-CM | POA: Insufficient documentation

## 2019-10-02 HISTORY — DX: Papillomavirus as the cause of diseases classified elsewhere: B97.7

## 2019-10-02 HISTORY — DX: Other seasonal allergic rhinitis: J30.2

## 2019-10-02 HISTORY — DX: Gastro-esophageal reflux disease without esophagitis: K21.9

## 2019-10-02 HISTORY — DX: Migraine, unspecified, not intractable, without status migrainosus: G43.909

## 2019-10-02 HISTORY — DX: Anxiety disorder, unspecified: F41.9

## 2019-10-02 HISTORY — DX: Depression, unspecified: F32.A

## 2019-10-02 HISTORY — DX: Dysplasia of cervix uteri, unspecified: N87.9

## 2019-10-02 HISTORY — DX: Hypothyroidism, unspecified: E03.9

## 2019-10-02 LAB — CBC
HCT: 32.3 % — ABNORMAL LOW (ref 36.0–46.0)
Hemoglobin: 10.1 g/dL — ABNORMAL LOW (ref 12.0–15.0)
MCH: 27.2 pg (ref 26.0–34.0)
MCHC: 31.3 g/dL (ref 30.0–36.0)
MCV: 86.8 fL (ref 80.0–100.0)
Platelets: 263 10*3/uL (ref 150–400)
RBC: 3.72 MIL/uL — ABNORMAL LOW (ref 3.87–5.11)
RDW: 13.2 % (ref 11.5–15.5)
WBC: 5.4 10*3/uL (ref 4.0–10.5)
nRBC: 0 % (ref 0.0–0.2)

## 2019-10-02 LAB — ABO/RH: ABO/RH(D): B POS

## 2019-10-02 NOTE — Progress Notes (Signed)
SPOKE W/  Virginia Marquez     SCREENING SYMPTOMS OF COVID 19:   COUGH--NO  RUNNY NOSE--- NO  SORE THROAT---NO  NASAL CONGESTION----NO  SNEEZING----NO  SHORTNESS OF BREATH---NO  DIFFICULTY BREATHING---NO  TEMP >100.0 -----NO  UNEXPLAINED BODY ACHES------NO  CHILLS -------- NO  HEADACHES ---------NO  LOSS OF SMELL/ TASTE --------NO    HAVE YOU OR ANY FAMILY MEMBER TRAVELLED PAST 14 DAYS OUT OF THE   COUNTY---Travelled to [facility] STATE----NO COUNTRY----NO  HAVE YOU OR ANY FAMILY MEMBER BEEN EXPOSED TO ANYONE WITH COVID 19? NO

## 2019-10-02 NOTE — Progress Notes (Signed)
PCP - Geni Bers Everardo All. Cardiologist - N/A  Chest x-ray -  N/A EKG -  N/A Stress Test -  N/A ECHO -  N/A Cardiac Cath -  N/A  Sleep Study -  N/A CPAP -  N/A  Fasting Blood Sugar -  N/A Checks Blood Sugar __ N/A___ times a day  Blood Thinner Instructions:   N/A Aspirin Instructions: N/A Last Dose:  N/A  Anesthesia review:   N/A   Patient denies shortness of breath, fever, cough and chest pain at PAT appointment   Patient verbalized understanding of instructions that were given to them at the PAT appointment. Patient was also instructed that they will need to review over the PAT instructions again at home before surgery.

## 2019-10-03 ENCOUNTER — Encounter: Payer: Self-pay | Admitting: Anesthesiology

## 2019-10-03 DIAGNOSIS — Z0289 Encounter for other administrative examinations: Secondary | ICD-10-CM

## 2019-10-04 ENCOUNTER — Telehealth: Payer: Self-pay | Admitting: *Deleted

## 2019-10-04 NOTE — Telephone Encounter (Signed)
Patient is scheduled for LAVH on 10/09/19 and wanted to know if you recommended getting her flu shot prior to surgery? Please advise

## 2019-10-05 ENCOUNTER — Other Ambulatory Visit (HOSPITAL_COMMUNITY)
Admission: RE | Admit: 2019-10-05 | Discharge: 2019-10-05 | Disposition: A | Discharge status: Home / Self Care | Payer: 59 | Source: Ambulatory Visit | Attending: Obstetrics & Gynecology | Admitting: Obstetrics & Gynecology

## 2019-10-05 DIAGNOSIS — Z20828 Contact with and (suspected) exposure to other viral communicable diseases: Secondary | ICD-10-CM | POA: Diagnosis not present

## 2019-10-05 DIAGNOSIS — Z01812 Encounter for preprocedural laboratory examination: Secondary | ICD-10-CM | POA: Diagnosis present

## 2019-10-05 NOTE — Telephone Encounter (Signed)
Patient informed. 

## 2019-10-05 NOTE — Telephone Encounter (Signed)
Yes Flu shot asap.

## 2019-10-08 LAB — NOVEL CORONAVIRUS, NAA (HOSP ORDER, SEND-OUT TO REF LAB; TAT 18-24 HRS): SARS-CoV-2, NAA: NOT DETECTED

## 2019-10-09 ENCOUNTER — Ambulatory Visit (HOSPITAL_BASED_OUTPATIENT_CLINIC_OR_DEPARTMENT_OTHER)
Admission: RE | Admit: 2019-10-09 | Discharge: 2019-10-10 | Disposition: A | Discharge status: Home / Self Care | Payer: 59 | Attending: Obstetrics & Gynecology | Admitting: Obstetrics & Gynecology

## 2019-10-09 ENCOUNTER — Ambulatory Visit (HOSPITAL_BASED_OUTPATIENT_CLINIC_OR_DEPARTMENT_OTHER): Payer: 59 | Admitting: Certified Registered"

## 2019-10-09 ENCOUNTER — Encounter (HOSPITAL_BASED_OUTPATIENT_CLINIC_OR_DEPARTMENT_OTHER)
Admission: RE | Disposition: A | Discharge status: Home / Self Care | Payer: Self-pay | Source: Home / Self Care | Attending: Obstetrics & Gynecology

## 2019-10-09 ENCOUNTER — Encounter (HOSPITAL_BASED_OUTPATIENT_CLINIC_OR_DEPARTMENT_OTHER): Payer: Self-pay

## 2019-10-09 DIAGNOSIS — Z79899 Other long term (current) drug therapy: Secondary | ICD-10-CM | POA: Diagnosis not present

## 2019-10-09 DIAGNOSIS — E039 Hypothyroidism, unspecified: Secondary | ICD-10-CM | POA: Insufficient documentation

## 2019-10-09 DIAGNOSIS — Z79890 Hormone replacement therapy (postmenopausal): Secondary | ICD-10-CM | POA: Diagnosis not present

## 2019-10-09 DIAGNOSIS — F419 Anxiety disorder, unspecified: Secondary | ICD-10-CM | POA: Diagnosis not present

## 2019-10-09 DIAGNOSIS — F329 Major depressive disorder, single episode, unspecified: Secondary | ICD-10-CM | POA: Insufficient documentation

## 2019-10-09 DIAGNOSIS — K219 Gastro-esophageal reflux disease without esophagitis: Secondary | ICD-10-CM | POA: Insufficient documentation

## 2019-10-09 DIAGNOSIS — R87810 Cervical high risk human papillomavirus (HPV) DNA test positive: Secondary | ICD-10-CM | POA: Diagnosis not present

## 2019-10-09 DIAGNOSIS — Z975 Presence of (intrauterine) contraceptive device: Secondary | ICD-10-CM | POA: Insufficient documentation

## 2019-10-09 DIAGNOSIS — Z9889 Other specified postprocedural states: Secondary | ICD-10-CM

## 2019-10-09 DIAGNOSIS — D069 Carcinoma in situ of cervix, unspecified: Secondary | ICD-10-CM | POA: Diagnosis not present

## 2019-10-09 DIAGNOSIS — N871 Moderate cervical dysplasia: Secondary | ICD-10-CM

## 2019-10-09 HISTORY — PX: LAPAROSCOPIC VAGINAL HYSTERECTOMY WITH SALPINGECTOMY: SHX6680

## 2019-10-09 LAB — TYPE AND SCREEN
ABO/RH(D): B POS
Antibody Screen: NEGATIVE

## 2019-10-09 LAB — POCT PREGNANCY, URINE: Preg Test, Ur: NEGATIVE

## 2019-10-09 SURGERY — HYSTERECTOMY, VAGINAL, LAPAROSCOPY-ASSISTED, WITH SALPINGECTOMY
Anesthesia: General | Site: Abdomen | Laterality: Bilateral

## 2019-10-09 MED ORDER — HYDROCODONE-ACETAMINOPHEN 5-325 MG PO TABS
1.0000 | ORAL_TABLET | ORAL | Status: DC | PRN
  Administered 2019-10-09 (×3): 1 via ORAL
  Administered 2019-10-10 (×2): 2 via ORAL
  Administered 2019-10-10: 1 via ORAL
  Filled 2019-10-09: qty 2

## 2019-10-09 MED ORDER — ONDANSETRON HCL 4 MG/2ML IJ SOLN
INTRAMUSCULAR | Status: DC | PRN
  Administered 2019-10-09: 4 mg via INTRAVENOUS

## 2019-10-09 MED ORDER — IBUPROFEN 800 MG PO TABS
ORAL_TABLET | ORAL | Status: AC
  Filled 2019-10-09: qty 1

## 2019-10-09 MED ORDER — PROPOFOL 10 MG/ML IV BOLUS
INTRAVENOUS | Status: DC | PRN
  Administered 2019-10-09: 200 mg via INTRAVENOUS

## 2019-10-09 MED ORDER — PROMETHAZINE HCL 25 MG/ML IJ SOLN
6.2500 mg | INTRAMUSCULAR | Status: DC | PRN
  Filled 2019-10-09: qty 1

## 2019-10-09 MED ORDER — FENTANYL CITRATE (PF) 100 MCG/2ML IJ SOLN
INTRAMUSCULAR | Status: DC | PRN
  Administered 2019-10-09 (×2): 50 ug via INTRAVENOUS
  Administered 2019-10-09: 25 ug via INTRAVENOUS
  Administered 2019-10-09: 50 ug via INTRAVENOUS
  Administered 2019-10-09: 25 ug via INTRAVENOUS

## 2019-10-09 MED ORDER — OXYCODONE-ACETAMINOPHEN 5-325 MG PO TABS
2.0000 | ORAL_TABLET | ORAL | Status: DC | PRN
  Filled 2019-10-09: qty 2

## 2019-10-09 MED ORDER — GLYCOPYRROLATE PF 0.2 MG/ML IJ SOSY
PREFILLED_SYRINGE | INTRAMUSCULAR | Status: DC | PRN
  Administered 2019-10-09: .2 mg via INTRAVENOUS

## 2019-10-09 MED ORDER — MIDAZOLAM HCL 2 MG/2ML IJ SOLN
INTRAMUSCULAR | Status: DC | PRN
  Administered 2019-10-09: 2 mg via INTRAVENOUS

## 2019-10-09 MED ORDER — DEXAMETHASONE SODIUM PHOSPHATE 10 MG/ML IJ SOLN
INTRAMUSCULAR | Status: AC
  Filled 2019-10-09: qty 1

## 2019-10-09 MED ORDER — HYDROCODONE-ACETAMINOPHEN 5-325 MG PO TABS
ORAL_TABLET | ORAL | Status: AC
  Filled 2019-10-09: qty 1

## 2019-10-09 MED ORDER — ROCURONIUM BROMIDE 10 MG/ML (PF) SYRINGE
PREFILLED_SYRINGE | INTRAVENOUS | Status: AC
  Filled 2019-10-09: qty 10

## 2019-10-09 MED ORDER — ACETAMINOPHEN 325 MG PO TABS
650.0000 mg | ORAL_TABLET | ORAL | Status: DC | PRN
  Filled 2019-10-09: qty 2

## 2019-10-09 MED ORDER — LACTATED RINGERS IV SOLN
INTRAVENOUS | Status: DC
  Administered 2019-10-09: 15:00:00 via INTRAVENOUS
  Filled 2019-10-09 (×2): qty 1000

## 2019-10-09 MED ORDER — LIDOCAINE 2% (20 MG/ML) 5 ML SYRINGE
INTRAMUSCULAR | Status: AC
  Filled 2019-10-09: qty 5

## 2019-10-09 MED ORDER — FENTANYL CITRATE (PF) 100 MCG/2ML IJ SOLN
INTRAMUSCULAR | Status: AC
  Filled 2019-10-09: qty 2

## 2019-10-09 MED ORDER — KETOROLAC TROMETHAMINE 30 MG/ML IJ SOLN
INTRAMUSCULAR | Status: DC | PRN
  Administered 2019-10-09: 30 mg via INTRAVENOUS

## 2019-10-09 MED ORDER — GLYCOPYRROLATE PF 0.2 MG/ML IJ SOSY
PREFILLED_SYRINGE | INTRAMUSCULAR | Status: AC
  Filled 2019-10-09: qty 1

## 2019-10-09 MED ORDER — BUPIVACAINE HCL (PF) 0.25 % IJ SOLN
INTRAMUSCULAR | Status: DC | PRN
  Administered 2019-10-09: 10 mL

## 2019-10-09 MED ORDER — LIDOCAINE-EPINEPHRINE (PF) 1 %-1:200000 IJ SOLN
INTRAMUSCULAR | Status: DC | PRN
  Administered 2019-10-09: 10 mL

## 2019-10-09 MED ORDER — DEXAMETHASONE SODIUM PHOSPHATE 10 MG/ML IJ SOLN
INTRAMUSCULAR | Status: DC | PRN
  Administered 2019-10-09: 10 mg via INTRAVENOUS

## 2019-10-09 MED ORDER — ONDANSETRON HCL 4 MG/2ML IJ SOLN
INTRAMUSCULAR | Status: AC
  Filled 2019-10-09: qty 2

## 2019-10-09 MED ORDER — HYDROCODONE-ACETAMINOPHEN 7.5-325 MG PO TABS
ORAL_TABLET | ORAL | Status: AC
  Filled 2019-10-09: qty 1

## 2019-10-09 MED ORDER — LIDOCAINE 2% (20 MG/ML) 5 ML SYRINGE
INTRAMUSCULAR | Status: DC | PRN
  Administered 2019-10-09: 80 mg via INTRAVENOUS

## 2019-10-09 MED ORDER — MEPERIDINE HCL 25 MG/ML IJ SOLN
INTRAMUSCULAR | Status: AC
  Filled 2019-10-09: qty 1

## 2019-10-09 MED ORDER — KETOROLAC TROMETHAMINE 30 MG/ML IJ SOLN
INTRAMUSCULAR | Status: AC
  Filled 2019-10-09: qty 1

## 2019-10-09 MED ORDER — ATROPINE SULFATE 1 MG/10ML IJ SOSY
PREFILLED_SYRINGE | INTRAMUSCULAR | Status: AC
  Filled 2019-10-09: qty 10

## 2019-10-09 MED ORDER — SUGAMMADEX SODIUM 200 MG/2ML IV SOLN
INTRAVENOUS | Status: DC | PRN
  Administered 2019-10-09: 170 mg via INTRAVENOUS

## 2019-10-09 MED ORDER — KETOROLAC TROMETHAMINE 30 MG/ML IJ SOLN
30.0000 mg | Freq: Once | INTRAMUSCULAR | Status: AC | PRN
  Administered 2019-10-09: 30 mg via INTRAVENOUS
  Filled 2019-10-09: qty 1

## 2019-10-09 MED ORDER — LACTATED RINGERS IV SOLN
INTRAVENOUS | Status: DC
  Administered 2019-10-09 (×2): via INTRAVENOUS
  Filled 2019-10-09: qty 1000

## 2019-10-09 MED ORDER — ROCURONIUM BROMIDE 10 MG/ML (PF) SYRINGE
PREFILLED_SYRINGE | INTRAVENOUS | Status: DC | PRN
  Administered 2019-10-09 (×2): 10 mg via INTRAVENOUS
  Administered 2019-10-09: 60 mg via INTRAVENOUS

## 2019-10-09 MED ORDER — CEFAZOLIN SODIUM-DEXTROSE 2-4 GM/100ML-% IV SOLN
2.0000 g | INTRAVENOUS | Status: AC
  Administered 2019-10-09: 2 g via INTRAVENOUS
  Filled 2019-10-09: qty 100

## 2019-10-09 MED ORDER — CEFAZOLIN SODIUM-DEXTROSE 2-4 GM/100ML-% IV SOLN
INTRAVENOUS | Status: AC
  Filled 2019-10-09: qty 100

## 2019-10-09 MED ORDER — MIDAZOLAM HCL 2 MG/2ML IJ SOLN
INTRAMUSCULAR | Status: AC
  Filled 2019-10-09: qty 2

## 2019-10-09 MED ORDER — HYDROMORPHONE HCL 1 MG/ML IJ SOLN
0.2500 mg | INTRAMUSCULAR | Status: DC | PRN
  Filled 2019-10-09: qty 0.5

## 2019-10-09 MED ORDER — SODIUM CHLORIDE 0.9 % IR SOLN
Status: DC | PRN
  Administered 2019-10-09: 3000 mL

## 2019-10-09 MED ORDER — PROPOFOL 10 MG/ML IV BOLUS
INTRAVENOUS | Status: AC
  Filled 2019-10-09: qty 20

## 2019-10-09 MED ORDER — MEPERIDINE HCL 25 MG/ML IJ SOLN
6.2500 mg | INTRAMUSCULAR | Status: DC | PRN
  Administered 2019-10-09: 6.25 mg via INTRAVENOUS
  Filled 2019-10-09: qty 1

## 2019-10-09 MED ORDER — ATROPINE SULFATE 0.4 MG/ML IV SOSY
PREFILLED_SYRINGE | INTRAVENOUS | Status: DC | PRN
  Administered 2019-10-09: .4 mg via INTRAVENOUS

## 2019-10-09 MED ORDER — HYDROMORPHONE HCL 1 MG/ML IJ SOLN
0.5000 mg | INTRAMUSCULAR | Status: DC | PRN
  Filled 2019-10-09: qty 0.5

## 2019-10-09 SURGICAL SUPPLY — 56 items
ADH SKN CLS APL DERMABOND .7 (GAUZE/BANDAGES/DRESSINGS) ×1
APL SRG 38 LTWT LNG FL B (MISCELLANEOUS)
APPLICATOR ARISTA FLEXITIP XL (MISCELLANEOUS) IMPLANT
BARRIER ADHS 3X4 INTERCEED (GAUZE/BANDAGES/DRESSINGS) IMPLANT
BRR ADH 4X3 ABS CNTRL BYND (GAUZE/BANDAGES/DRESSINGS)
CABLE HIGH FREQUENCY MONO STRZ (ELECTRODE) ×2 IMPLANT
COVER BACK TABLE 60X90IN (DRAPES) ×3 IMPLANT
COVER MAYO STAND STRL (DRAPES) ×6 IMPLANT
COVER SURGICAL LIGHT HANDLE (MISCELLANEOUS) ×2 IMPLANT
COVER WAND RF STERILE (DRAPES) ×3 IMPLANT
DERMABOND ADVANCED (GAUZE/BANDAGES/DRESSINGS) ×2
DERMABOND ADVANCED .7 DNX12 (GAUZE/BANDAGES/DRESSINGS) ×1 IMPLANT
DRSG COVADERM PLUS 2X2 (GAUZE/BANDAGES/DRESSINGS) IMPLANT
DRSG OPSITE POSTOP 3X4 (GAUZE/BANDAGES/DRESSINGS) IMPLANT
DURAPREP 26ML APPLICATOR (WOUND CARE) ×3 IMPLANT
ELECT REM PT RETURN 9FT ADLT (ELECTROSURGICAL) ×3
ELECTRODE REM PT RTRN 9FT ADLT (ELECTROSURGICAL) ×1 IMPLANT
GAUZE 4X4 16PLY RFD (DISPOSABLE) ×3 IMPLANT
GAUZE PACKING IODOFORM 1X5 (MISCELLANEOUS) IMPLANT
GLOVE BIO SURGEON STRL SZ 6.5 (GLOVE) ×4 IMPLANT
GLOVE BIO SURGEONS STRL SZ 6.5 (GLOVE) ×2
GLOVE BIOGEL PI IND STRL 6.5 (GLOVE) IMPLANT
GLOVE BIOGEL PI IND STRL 7.0 (GLOVE) ×2 IMPLANT
GLOVE BIOGEL PI INDICATOR 6.5 (GLOVE) ×2
GLOVE BIOGEL PI INDICATOR 7.0 (GLOVE) ×4
GLOVE ECLIPSE 6.5 STRL STRAW (GLOVE) ×2 IMPLANT
GLOVE LITE  25/BX (GLOVE) IMPLANT
GOWN STRL REUS W/TWL LRG LVL3 (GOWN DISPOSABLE) ×8 IMPLANT
GOWN STRL REUS W/TWL XL LVL3 (GOWN DISPOSABLE) ×6 IMPLANT
HEMOSTAT ARISTA ABSORB 3G PWDR (HEMOSTASIS) IMPLANT
NDL MAYO CATGUT SZ4 TPR NDL (NEEDLE) IMPLANT
NEEDLE MAYO CATGUT SZ4 (NEEDLE) IMPLANT
PACK LAVH (CUSTOM PROCEDURE TRAY) ×3 IMPLANT
PACK TRENDGUARD 450 HYBRID PRO (MISCELLANEOUS) ×1 IMPLANT
PROTECTOR NERVE ULNAR (MISCELLANEOUS) ×6 IMPLANT
SCISSORS LAP 5X35 DISP (ENDOMECHANICALS) IMPLANT
SEALER TISSUE G2 CVD JAW 35 (ENDOMECHANICALS) IMPLANT
SEALER TISSUE G2 CVD JAW 45CM (ENDOMECHANICALS)
SET IRRIG TUBING LAPAROSCOPIC (IRRIGATION / IRRIGATOR) ×3 IMPLANT
SET TUBE SMOKE EVAC HIGH FLOW (TUBING) ×3 IMPLANT
SHEARS HARMONIC ACE PLUS 36CM (ENDOMECHANICALS) ×3 IMPLANT
SOLUTION ELECTROLUBE (MISCELLANEOUS) IMPLANT
SUT CHROMIC 0 CT 1 (SUTURE) IMPLANT
SUT MNCRL AB 4-0 PS2 18 (SUTURE) ×8 IMPLANT
SUT VIC AB 0 CT1 18XCR BRD8 (SUTURE) ×2 IMPLANT
SUT VIC AB 0 CT1 27 (SUTURE) ×9
SUT VIC AB 0 CT1 27XBRD ANBCTR (SUTURE) ×3 IMPLANT
SUT VIC AB 0 CT1 8-18 (SUTURE) ×6
SUT VICRYL 0 TIES 12 18 (SUTURE) ×3 IMPLANT
SUT VICRYL 0 UR6 27IN ABS (SUTURE) ×3 IMPLANT
TOWEL OR 17X26 10 PK STRL BLUE (TOWEL DISPOSABLE) ×3 IMPLANT
TRAY FOLEY W/BAG SLVR 14FR (SET/KITS/TRAYS/PACK) ×3 IMPLANT
TRENDGUARD 450 HYBRID PRO PACK (MISCELLANEOUS) ×3
TROCAR BALLN 12MMX100 BLUNT (TROCAR) ×3 IMPLANT
TROCAR XCEL NON-BLD 5MMX100MML (ENDOMECHANICALS) ×6 IMPLANT
WARMER LAPAROSCOPE (MISCELLANEOUS) ×3 IMPLANT

## 2019-10-09 NOTE — Transfer of Care (Signed)
Immediate Anesthesia Transfer of Care Note  Patient: Virginia Marquez  Procedure(s) Performed: Procedure(s) (LRB): LAPAROSCOPIC ASSISTED VAGINAL HYSTERECTOMY WITH SALPINGECTOMY (Bilateral)  Patient Location: PACU  Anesthesia Type: General  Level of Consciousness: awake, oriented, sedated and patient cooperative  Airway & Oxygen Therapy: Patient Spontanous Breathing and Patient connected to face mask oxygen  Post-op Assessment: Report given to PACU RN and Post -op Vital signs reviewed and stable  Post vital signs: Reviewed and stable  Complications: No apparent anesthesia complications Last Vitals:  Vitals Value Taken Time  BP 139/86 10/09/19 1219  Temp    Pulse 87 10/09/19 1222  Resp 13 10/09/19 1222  SpO2 97 % 10/09/19 1222  Vitals shown include unvalidated device data.  Last Pain:  Vitals:   10/09/19 0819  TempSrc: Oral  PainSc: 0-No pain      Patients Stated Pain Goal: 4 (10/09/19 0819)

## 2019-10-09 NOTE — Anesthesia Postprocedure Evaluation (Signed)
Anesthesia Post Note  Patient: Virginia Marquez  Procedure(s) Performed: LAPAROSCOPIC ASSISTED VAGINAL HYSTERECTOMY WITH SALPINGECTOMY (Bilateral Abdomen)     Patient location during evaluation: PACU Anesthesia Type: General Level of consciousness: awake Pain management: pain level controlled Vital Signs Assessment: post-procedure vital signs reviewed and stable Respiratory status: spontaneous breathing Cardiovascular status: stable Postop Assessment: no apparent nausea or vomiting Anesthetic complications: no    Last Vitals:  Vitals:   10/09/19 1311 10/09/19 1343  BP: 118/81 119/72  Pulse: 77 78  Resp:  16  Temp: 36.8 C 36.8 C  SpO2: 100% 100%    Last Pain:  Vitals:   10/09/19 1311  TempSrc:   PainSc: 2    Pain Goal: Patients Stated Pain Goal: 4 (10/09/19 1311)                 Huston Foley

## 2019-10-09 NOTE — Anesthesia Preprocedure Evaluation (Signed)
Anesthesia Evaluation  Patient identified by MRN, date of birth, ID band Patient awake    Reviewed: Allergy & Precautions, NPO status , Patient's Chart, lab work & pertinent test results  Airway Mallampati: I       Dental no notable dental hx. (+) Teeth Intact   Pulmonary neg pulmonary ROS,    Pulmonary exam normal breath sounds clear to auscultation       Cardiovascular negative cardio ROS Normal cardiovascular exam Rhythm:Regular Rate:Normal     Neuro/Psych  Headaches, PSYCHIATRIC DISORDERS Anxiety Depression    GI/Hepatic Neg liver ROS, GERD  Medicated and Controlled,  Endo/Other  Hypothyroidism   Renal/GU negative Renal ROS  negative genitourinary   Musculoskeletal   Abdominal Normal abdominal exam  (+)   Peds  Hematology  (+) Blood dyscrasia, anemia ,   Anesthesia Other Findings   Reproductive/Obstetrics negative OB ROS                             Anesthesia Physical Anesthesia Plan  ASA: II  Anesthesia Plan: General   Post-op Pain Management:    Induction: Intravenous  PONV Risk Score and Plan: 4 or greater and Ondansetron, Dexamethasone, Midazolam and Scopolamine patch - Pre-op  Airway Management Planned: Oral ETT  Additional Equipment: None  Intra-op Plan:   Post-operative Plan: Extubation in OR  Informed Consent: I have reviewed the patients History and Physical, chart, labs and discussed the procedure including the risks, benefits and alternatives for the proposed anesthesia with the patient or authorized representative who has indicated his/her understanding and acceptance.     Dental advisory given  Plan Discussed with: CRNA  Anesthesia Plan Comments:         Anesthesia Quick Evaluation

## 2019-10-09 NOTE — Op Note (Signed)
Operative Note  10/09/2019  12:17 PM  PATIENT:  Virginia Marquez  43 y.o. female  PRE-OPERATIVE DIAGNOSIS:  Severe cervical dysplasia  POST-OPERATIVE DIAGNOSIS:  Severe cervical dysplasia  PROCEDURE:  Procedure(s): LAPAROSCOPIC ASSISTED VAGINAL HYSTERECTOMY WITH BILATERAL SALPINGECTOMY  SURGEON:  Surgeon(s): Princess Bruins, MD Fontaine, Belinda Block, MD  ANESTHESIA:   general  FINDINGS: Uterus and Tubes normal in appearance.  Bilateral Ovaries normal.    DESCRIPTION OF OPERATION: Under general anesthesia with endotracheal intubation the patient is in lithotomy position.  She is prepped with DuraPrep on the abdomen and with Betadine on the suprapubic, vulvar and vaginal areas.  The Foley is inserted in the bladder.  Timeout is done.  The vaginal exam reveals an anteverted uterus, normal volume, mobile.  No adnexal mass.  The speculum is inserted into the vagina.  The Mirena IUD is removed easily by pulling on the strings with a clamp, it is intact and complete, shown to the OR staff and discarded.  The uterine cannula with 1 tooth is put in place.  The speculum is removed.  The patient is draped as usual.  We go to the abdomen.      The infraumbilical area is infiltrated with Marcaine one quarter plain 8 cc.  A 1.5 cm incision is made at that level with the scalpel.  The aponeurosis is grasped with 2 Coker's.  The aponeurosis is open with Mayo scissors.  The parietal peritoneum is open bluntly with the finger.  A pursestring stitch of Vicryl 0 is done on the aponeurosis.  The Sheryle Hail is inserted at that level under direct vision.  Pneumoperitoneum is created with CO2.  The camera is inserted.  Inspection of the abdominal pelvic cavities reveal an anteverted uterus, normal volume and appearance.  Both tubes are normal.  Both ovaries are normal in size and appearance.  No pelvic lesion visible.  The appendix is normal.  The liver is normal.  Pictures are taken of all those structures.  We make 2  contralateral incisions one on the right one on the left lower abdomen after infiltrating with Marcaine one quarter plain.  Both incisions are 5 mm.  5 mm ports are inserted at each location under direct vision.  Both ureters are seen in normal anatomic position with good peristalsis.  We start on the left side cauterizing and sectioning the left mesosalpinx with the harmonic scalpel.  We then cauterized and section the left round ligament.  We cauterized and sectioned the left utero-ovarian ligament.  We opened the visceral peritoneum anteriorly.  We proceeded exactly the same way on the right side.  We completed the opening of the visceral peritoneum anteriorly and distended the bladder slightly.  Hemostasis was completed where necessary with the bipolar clamp.  The laparoscopic instruments were then removed.  The CO2 gas was stopped.  We covered the abdomen with a sterile drape.  We went to vaginal time.      The stirrups were raised for the vaginal approach.  A weighted speculum was inserted into the vagina.  2 Jacobs clamps were applied on the cervix.  The junction between vagina and cervix was infiltrated with lidocaine with epinephrine circumferentially.  We used the Bovie with coag mode to open the junction between vagina and cervix circumferentially.  The visceral peritoneum was opened anteriorly and the retractor was put at that level.  We then opened the peritoneum posteriorly and switched to the long narrow weighted speculum.  We clamped the left cardinal ligaments  and uterosacral ligament with a curved Heaney, we sectioned with Mayo scissors and sutured with Vicryl 0 in a Heaney stitch.  This was kept on a hemostat.  We proceeded the same way on the right side.  We then used the LigaSure to cauterize and section the right uterine artery and then the left.  We went up at the right and left broad ligament cauterizing and sectioning with the LigaSure until the uterus was completely detached on the left  side.  We used a 1 to use tenaculum to invert the uterus posteriorly which allowed [place] to clamp the last pedicle left on the right side with the LigaSure, we cauterized and sectioned.  The uterus was removed with the cervix and both tubes and sent to pathology.  We switch back to the short weighted speculum.  We verified hemostasis, which was adequate at all levels.  We used a Vicryl 0 for a suspension stitch including the uterosacral ligaments on each side.  We then did a locked running stitch of Vicryl 0 on the posterior vagina.  We closed the vaginal vault with figures-of-eight Vicryl 0 stitches.  Hemostasis was adequate at all levels.  All instruments were removed from the vagina.  We went back to laparoscopy time.      We recreated the pneumoperitoneum with CO2.  We irrigated and suction the abdominal pelvic cavities.  We completed hemostasis with a bipolar clamp at the left ovary.  Hemostasis was adequate at all levels.  We took pictures of the pelvic area.  The laparoscopic instruments were removed.  The ports were removed under direct vision.  The CO2 was evacuated.  The pursestring stitch was attached at the infraumbilical incision.  We closed the 3 skin incisions with separate stitches of Vicryl 4-0.  We added Dermabond on the 3 incisions.  The patient was brought to recovery room in good and stable status.  ESTIMATED BLOOD LOSS: 200 mL   Intake/Output Summary (Last 24 hours) at 10/09/2019 1217 Last data filed at 10/09/2019 1131 Gross per 24 hour  Intake 1000 ml  Output 500 ml  Net 500 ml     BLOOD ADMINISTERED:none   LOCAL MEDICATIONS USED:  MARCAINE     SPECIMEN:  Source of Specimen:  Uterus/Cervix with bilateral tubes  DISPOSITION OF SPECIMEN:  PATHOLOGY  COUNTS:  YES  PLAN OF CARE: Transfer to PACU  Marie-Lyne LavoieMD12:17 PM

## 2019-10-09 NOTE — H&P (Signed)
  Virginia Marquez [birth year] [id number]        43 y.o.  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  No desire to preserve fertility.  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     OB History  Gravida Para Term Preterm AB Living  4 2     2 2   SAB TAB Ectopic Multiple Live Births  2            # Outcome Date GA Lbr Len/2nd Weight Sex Delivery Anes PTL Lv  4 SAB           3 SAB           2 Para           1 Para             Past medical history,surgical history, problem list, medications, allergies, family history and social history were all reviewed and documented in the EPIC chart.   Directed ROS with pertinent positives and negatives documented in the history of present illness/assessment and plan.  Exam:  There were no vitals filed for this visit. General appearance:  Normal  Gynecologic exam normal AV uterus/no adnexal mass on 07/31/2019.   Assessment/Plan:  43 y.o. [id number]   1. 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  Counseling on management of severe cervical dysplasia reviewed.  After counseling on LEEP and Total Hysterectomy, given severe dysplasia/HPV 16 pos and no desire to conceive, patient prefers Total Hysterectomy.  Decision to proceed with LAVH/Bilateral Salpingectomy.  Surgery and risks thoroughly reviewed.  The risk of still having vaginal cells/vulvar cells infected with HPV 16 discussed.  Preop preparation and postop precautions discussed.    2. 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 Infection (prohylactic antibiotics will be administered)  2. DVT/Pulmonary Embolism (prophylactic pneumo compression stockings will be used)  3.Trauma to internal organs requiring  additional surgical procedure to repair any injury to internal organs requiring perhaps additional hospitalization days.  4.Hemmorhage requiring transfusion and blood products which carry risks such as anaphylactic reaction, hepatitis and AIDS  Patient had received literature information on the procedure scheduled and all her questions were answered and fully accepts all risk.  Princess Bruins MD, 7:56 AM 10/09/2019

## 2019-10-09 NOTE — Anesthesia Procedure Notes (Signed)
Procedure Name: Intubation Date/Time: 10/09/2019 10:21 AM Performed by: Suan Halter, CRNA Pre-anesthesia Checklist: Patient identified, Emergency Drugs available, Suction available and Patient being monitored Patient Re-evaluated:Patient Re-evaluated prior to induction Oxygen Delivery Method: Circle system utilized Preoxygenation: Pre-oxygenation with 100% oxygen Induction Type: IV induction Ventilation: Mask ventilation without difficulty Laryngoscope Size: Mac and 3 Grade View: Grade I Tube type: Oral Tube size: 7.0 mm Number of attempts: 1 Airway Equipment and Method: Stylet and Oral airway Placement Confirmation: ETT inserted through vocal cords under direct vision,  positive ETCO2 and breath sounds checked- equal and bilateral Secured at: 22 cm Tube secured with: Tape Dental Injury: Teeth and Oropharynx as per pre-operative assessment

## 2019-10-10 DIAGNOSIS — D069 Carcinoma in situ of cervix, unspecified: Secondary | ICD-10-CM | POA: Diagnosis not present

## 2019-10-10 LAB — CBC
HCT: 28.8 % — ABNORMAL LOW (ref 36.0–46.0)
Hemoglobin: 9.1 g/dL — ABNORMAL LOW (ref 12.0–15.0)
MCH: 27.3 pg (ref 26.0–34.0)
MCHC: 31.6 g/dL (ref 30.0–36.0)
MCV: 86.5 fL (ref 80.0–100.0)
Platelets: 256 10*3/uL (ref 150–400)
RBC: 3.33 MIL/uL — ABNORMAL LOW (ref 3.87–5.11)
RDW: 13.5 % (ref 11.5–15.5)
WBC: 13.1 10*3/uL — ABNORMAL HIGH (ref 4.0–10.5)
nRBC: 0 % (ref 0.0–0.2)

## 2019-10-10 MED ORDER — HYDROCODONE-ACETAMINOPHEN 5-325 MG PO TABS
ORAL_TABLET | ORAL | Status: AC
  Filled 2019-10-10: qty 1

## 2019-10-10 MED ORDER — HYDROCODONE-ACETAMINOPHEN 5-325 MG PO TABS: 1.0000 | ORAL_TABLET | Freq: Four times a day (QID) | ORAL | 0 refills | Status: DC | PRN

## 2019-10-10 MED ORDER — HYDROCODONE-ACETAMINOPHEN 5-325 MG PO TABS
ORAL_TABLET | ORAL | Status: AC
  Filled 2019-10-10: qty 2

## 2019-10-10 NOTE — Progress Notes (Signed)
POD#1 LAVH/Bilateral Salpingectomy  Subjective: Patient reports tolerating PO, + flatus and no problems voiding.    Objective: I have reviewed patient's vital signs.  vital signs, intake and output, medications and labs.  Vitals:   10/10/19 0556 10/10/19 0800  BP: (!) 107/56 116/68  Pulse: 74 71  Resp: 18 18  Temp: 98.3 F (36.8 C) 98.2 F (36.8 C)  SpO2: 96% 98%   I/O last 3 completed shifts: In: 3021.3 [P.O.:590; I.V.:2331.3; IV Piggyback:100] Out: 2800 [Urine:2600; Blood:200] No intake/output data recorded.  Results for orders placed or performed during the hospital encounter of 10/09/19 (from the past 24 hour(s))  CBC     Status: Abnormal   Collection Time: 10/10/19  5:53 AM  Result Value Ref Range   WBC 13.1 (H) 4.0 - 10.5 K/uL   RBC 3.33 (L) 3.87 - 5.11 MIL/uL   Hemoglobin 9.1 (L) 12.0 - 15.0 g/dL   HCT 28.8 (L) 36.0 - 46.0 %   MCV 86.5 80.0 - 100.0 fL   MCH 27.3 26.0 - 34.0 pg   MCHC 31.6 30.0 - 36.0 g/dL   RDW 13.5 11.5 - 15.5 %   Platelets 256 150 - 400 K/uL   nRBC 0.0 0.0 - 0.2 %    EXAM General: alert and cooperative Resp: clear to auscultation bilaterally Cardio: regular rate and rhythm GI: soft, non-tender; bowel sounds normal; no masses,  no organomegaly and incision: clean, dry and intact Extremities: no edema, redness or tenderness in the calves or thighs Vaginal Bleeding: minimal  Assessment: s/p Procedure(s): LAPAROSCOPIC ASSISTED VAGINAL HYSTERECTOMY WITH SALPINGECTOMY: stable, progressing well and tolerating diet  Plan: Advance diet Encourage ambulation Advance to PO medication Discontinue IV fluids Discharge home  LOS: 0 days    Princess Bruins, MD 10/10/2019 9:03 AM    10/10/2019, 9:03 AM

## 2019-10-10 NOTE — Discharge Instructions (Signed)
Laparoscopically Assisted Vaginal Hysterectomy, Care After °This sheet gives you information about how to care for yourself after your procedure. Your health care provider may also give you more specific instructions. If you have problems or questions, contact your health care provider. °What can I expect after the procedure? °After the procedure, it is common to have: °· Soreness and numbness in your incision areas. °· Abdominal pain. You will be given pain medicine to control it. °· Vaginal bleeding and discharge. You will need to use a sanitary napkin after this procedure. °· Sore throat from the breathing tube that was inserted during surgery. °Follow these instructions at home: °Medicines °· Take over-the-counter and prescription medicines only as told by your health care provider. °· Do not take aspirin or ibuprofen. These medicines can cause bleeding. °· Do not drive or use heavy machinery while taking prescription pain medicine. °· Do not drive for 24 hours if you were given a medicine to help you relax (sedative) during the procedure. °Incision care ° °· Follow instructions from your health care provider about how to take care of your incisions. Make sure you: °? Wash your hands with soap and water before you change your bandage (dressing). If soap and water are not available, use hand sanitizer. °? Change your dressing as told by your health care provider. °? Leave stitches (sutures), skin glue, or adhesive strips in place. These skin closures may need to stay in place for 2 weeks or longer. If adhesive strip edges start to loosen and curl up, you may trim the loose edges. Do not remove adhesive strips completely unless your health care provider tells you to do that. °· Check your incision area every day for signs of infection. Check for: °? Redness, swelling, or pain. °? Fluid or blood. °? Warmth. °? Pus or a bad smell. °Activity °· Get regular exercise as told by your health care provider. You may be  told to take short walks every day and go farther each time. °· Return to your normal activities as told by your health care provider. Ask your health care provider what activities are safe for you. °· Do not douche, use tampons, or have sexual intercourse for at least 6 weeks, or until your health care provider gives you permission. °· Do not lift anything that is heavier than 10 lb (4.5 kg), or the limit that your health care provider tells you, until he or she says that it is safe. °General instructions °· Do not take baths, swim, or use a hot tub until your health care provider approves. Take showers instead of baths. °· Do not drive for 24 hours if you received a sedative. °· Do not drive or operate heavy machinery while taking prescription pain medicine. °· To prevent or treat constipation while you are taking prescription pain medicine, your health care provider may recommend that you: °? Drink enough fluid to keep your urine clear or pale yellow. °? Take over-the-counter or prescription medicines. °? Eat foods that are high in fiber, such as fresh fruits and vegetables, whole grains, and beans. °? Limit foods that are high in fat and processed sugars, such as fried and sweet foods. °· Keep all follow-up visits as told by your health care provider. This is important. °Contact a health care provider if: °· You have signs of infection, such as: °? Redness, swelling, or pain around your incision sites. °? Fluid or blood coming from an incision. °? An incision that feels warm to the   touch. °? Pus or a bad smell coming from an incision. °· Your incision breaks open. °· Your pain medicine is not helping. °· You feel dizzy or light-headed. °· You have pain or bleeding when you urinate. °· You have persistent nausea and vomiting. °· You have blood, pus, or a bad-smelling discharge from your vagina. °Get help right away if: °· You have a fever. °· You have severe abdominal pain. °· You have chest pain. °· You have  shortness of breath. °· You faint. °· You have pain, swelling, or redness in your leg. °· You have heavy bleeding from your vagina. °Summary °· After the procedure, it is common to have abdominal pain and vaginal bleeding. °· You should not drive or lift heavy objects until your health care provider says that it is safe. °· Contact your health care provider if you have any symptoms of infection, excessive vaginal bleeding, nausea, vomiting, or shortness of breath. °This information is not intended to replace advice given to you by your health care provider. Make sure you discuss any questions you have with your health care provider. °Document Released: 11/18/2011 Document Revised: 11/11/2017 Document Reviewed: 01/25/2017 °Elsevier Patient Education © 2020 [employer] ° °

## 2019-10-11 ENCOUNTER — Encounter (HOSPITAL_BASED_OUTPATIENT_CLINIC_OR_DEPARTMENT_OTHER): Payer: Self-pay | Admitting: Obstetrics & Gynecology

## 2019-10-12 LAB — SURGICAL PATHOLOGY

## 2019-10-16 NOTE — Telephone Encounter (Signed)
Per dpr access note on file I left message in voice mail.

## 2019-10-17 ENCOUNTER — Telehealth: Payer: Self-pay

## 2019-10-17 NOTE — Telephone Encounter (Signed)
Patient is thinking she would like to try to go back to work for 4 hours a day starting Monday, 10/22/19. No severe pain and took last narcotic this past Sunday.  Ok?

## 2019-10-17 NOTE — Telephone Encounter (Signed)
LAVH was 10/09/2019. Patient would like to know what driving restrictions she has.

## 2019-10-17 NOTE — Telephone Encounter (Signed)
Can drive as long as no severe pain and not taking a narcotic.

## 2019-10-17 NOTE — Telephone Encounter (Signed)
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 Asked her to call and let me know about how she wanted to get letter for work. I have release from on file to fax to [employer] where I sent FMLA.

## 2019-10-17 NOTE — Telephone Encounter (Signed)
Yes, agree

## 2019-10-18 NOTE — Telephone Encounter (Signed)
Patient called and asked me to fax letter to [facility] as I did her FMLA forms.  Letter faxed and confimation received. Copy of letter in chart.

## 2019-10-31 ENCOUNTER — Other Ambulatory Visit: Payer: Self-pay

## 2019-11-01 ENCOUNTER — Encounter: Payer: Self-pay | Admitting: Obstetrics & Gynecology

## 2019-11-01 ENCOUNTER — Ambulatory Visit (INDEPENDENT_AMBULATORY_CARE_PROVIDER_SITE_OTHER): Payer: 59 | Admitting: Obstetrics & Gynecology

## 2019-11-01 VITALS — BP 130/80

## 2019-11-01 DIAGNOSIS — Z09 Encounter for follow-up examination after completed treatment for conditions other than malignant neoplasm: Secondary | ICD-10-CM

## 2019-11-01 NOTE — Patient Instructions (Signed)
1. Status post gynecological surgery, follow-up exam Very good post op healing after LAVH/bilateral salpingectomy on October 09, 2019.  Pathology reviewed with patient, CIN-2/3 present.  Recommend no fitness activity yet.  No sexual activity until reevaluation in 4 weeks.  Precautions reviewed.  Virginia Marquez, it was a pleasure seeing you today!

## 2019-11-01 NOTE — Progress Notes (Signed)
    Virginia Marquez June 03, 1976 [id number]        43 y.o.  [id number] Single  RP: 3 weeks postop LAVH/Bilateral Salpingectomy  HPI: Minor vaginal spotting when more physically active.  Started back working.  No pelvic pain.  Urine/BMs normal.  No fever.   OB History  Gravida Para Term Preterm AB Living  4 2     2 2   SAB TAB Ectopic Multiple Live Births  2            # Outcome Date GA Lbr Len/2nd Weight Sex Delivery Anes PTL Lv  4 SAB           3 SAB           2 Para           1 Para             Past medical history,surgical history, problem list, medications, allergies, family history and social history were all reviewed and documented in the EPIC chart.   Directed ROS with pertinent positives and negatives documented in the history of present illness/assessment and plan.  Exam:  Vitals:   11/01/19 1419  BP: 130/80   General appearance:  Normal  Abdomen: Incisions healing very well, closed, no erythema.  Gynecologic exam: Vulva normal.  Speculum:  Vaginal vault healing well, no bleeding, no sign of infection.  Patho 10/09/19: UTERUS, CERVIX, BILATERAL FALLOPIAN TUBES, HYSTERECTOMY WITH SALPINGECTOMY:  - Foci of high-grade squamous intraepithelial lesion (CIN2-3, high grade  dysplasia). See comment  - Low-grade squamous intraepithelial lesion (CIN1, low grade dysplasia)  - Benign inactive endometrium  - Benign unremarkable bilateral fallopian tubes  - No evidence of malignancy   Assessment/Plan:  43 y.o. [id number]   1. Status post gynecological surgery, follow-up exam Very good post op healing after LAVH/bilateral salpingectomy on October 09, 2019.  Pathology reviewed with patient, CIN-2/3 present.  Recommend no fitness activity yet.  No sexual activity until reevaluation in 4 weeks.  Precautions reviewed.   Princess Bruins MD, 2:36 PM 11/01/2019

## 2019-11-28 ENCOUNTER — Other Ambulatory Visit: Payer: Self-pay

## 2019-11-29 ENCOUNTER — Encounter: Payer: Self-pay | Admitting: Obstetrics & Gynecology

## 2019-11-29 ENCOUNTER — Ambulatory Visit (INDEPENDENT_AMBULATORY_CARE_PROVIDER_SITE_OTHER): Payer: 59 | Admitting: Obstetrics & Gynecology

## 2019-11-29 VITALS — BP 102/70

## 2019-11-29 DIAGNOSIS — Z09 Encounter for follow-up examination after completed treatment for conditions other than malignant neoplasm: Secondary | ICD-10-CM

## 2019-11-29 DIAGNOSIS — R300 Dysuria: Secondary | ICD-10-CM

## 2019-11-29 MED ORDER — CIPROFLOXACIN HCL 500 MG PO TABS: 500.0000 mg | ORAL_TABLET | Freq: Two times a day (BID) | ORAL | 0 refills | Status: AC

## 2019-11-29 NOTE — Progress Notes (Signed)
    Virginia Marquez 28-Aug-1976 [id number]        43 y.o.  [id number]   RP: Postop LAVH/Bilateral Salpingectomy 10/09/19  HPI: Doing very well with no severe abdomen/pelvic pain.  No vaginal bleeding.  No vaginal discharge.  But some suprapubic and urination discomfort, similar to when got a bladder infection in the past.  No frequency.  BMs normal.  No fever.  Started exercising progressively.   OB History  Gravida Para Term Preterm AB Living  4 2     2 2   SAB TAB Ectopic Multiple Live Births  2            # Outcome Date GA Lbr Len/2nd Weight Sex Delivery Anes PTL Lv  4 SAB           3 SAB           2 Para           1 Para             Past medical history,surgical history, problem list, medications, allergies, family history and social history were all reviewed and documented in the EPIC chart.   Directed ROS with pertinent positives and negatives documented in the history of present illness/assessment and plan.  Exam:  Vitals:   11/29/19 1226  BP: 102/70   General appearance:  Normal  Abdomen: Normal.  Incisions well healed.  Gynecologic exam: Vulva normal.  Bimanual exam:  Vaginal vault well closed, no induration, NT.  No pelvic mass felt, NT.    U/A: Yellow cloudy, protein negative, nitrites negative, white blood cells 10-20, red blood cells 0-2, many bacteria.  Urine culture pending.   Assessment/Plan:  43 y.o. [id number]   1. Status post gynecological surgery, follow-up exam Good postop evolution, healed well.  Can resume all physical activities and sexual activities.    2. Dysuria Urine analysis abnormal, probable acute cystitis.  Allergy to sulfa.  Will treat with ciprofloxacin 500 mg p.o. twice a day for 7 days.  Prescription sent to pharmacy.  Pending urine culture. - Urinalysis,Complete w/RFL Culture  Other orders - ciprofloxacin (CIPRO) 500 MG tablet; Take 1 tablet (500 mg total) by mouth 2 (two) times daily for 7 days.  Follow-up annual gynecologic  exam.  Counseling on above issues and coordination of care more than 50% for 15 minutes.  Princess Bruins MD, 12:35 PM 11/29/2019

## 2019-11-29 NOTE — Patient Instructions (Signed)
1. Status post gynecological surgery, follow-up exam Good postop evolution, healed well.  Can resume all physical activities and sexual activities.    2. Dysuria Urine analysis abnormal, probable acute cystitis.  Allergy to sulfa.  Will treat with ciprofloxacin 500 mg p.o. twice a day for 7 days.  Prescription sent to pharmacy.  Pending urine culture. - Urinalysis,Complete w/RFL Culture  Other orders - ciprofloxacin (CIPRO) 500 MG tablet; Take 1 tablet (500 mg total) by mouth 2 (two) times daily for 7 days.  Follow-up annual gynecologic exam.  Virginia Marquez, it was a pleasure seeing you today!  I will inform you of your results as soon as they are available.

## 2019-12-01 LAB — URINALYSIS, COMPLETE W/RFL CULTURE
Bilirubin Urine: NEGATIVE
Glucose, UA: NEGATIVE
Hyaline Cast: NONE SEEN /LPF
Ketones, ur: NEGATIVE
Nitrites, Initial: NEGATIVE
Protein, ur: NEGATIVE
Specific Gravity, Urine: 1.025 (ref 1.001–1.03)
pH: 5.5 (ref 5.0–8.0)

## 2019-12-01 LAB — URINE CULTURE
MICRO NUMBER:: 1208315
SPECIMEN QUALITY:: ADEQUATE

## 2019-12-01 LAB — CULTURE INDICATED

## 2019-12-12 ENCOUNTER — Ambulatory Visit
Admission: RE | Admit: 2019-12-12 | Discharge: 2019-12-12 | Disposition: A | Discharge status: Home / Self Care | Payer: 59 | Source: Ambulatory Visit | Attending: Obstetrics & Gynecology | Admitting: Obstetrics & Gynecology

## 2019-12-12 ENCOUNTER — Other Ambulatory Visit: Payer: Self-pay

## 2019-12-12 DIAGNOSIS — Z1231 Encounter for screening mammogram for malignant neoplasm of breast: Secondary | ICD-10-CM

## 2020-07-31 ENCOUNTER — Encounter: Payer: Self-pay | Admitting: Obstetrics & Gynecology

## 2020-07-31 ENCOUNTER — Ambulatory Visit (INDEPENDENT_AMBULATORY_CARE_PROVIDER_SITE_OTHER): Payer: 59 | Admitting: Obstetrics & Gynecology

## 2020-07-31 ENCOUNTER — Other Ambulatory Visit: Payer: Self-pay

## 2020-07-31 VITALS — BP 110/70 | Ht 68.0 in | Wt 182.0 lb

## 2020-07-31 DIAGNOSIS — Z1272 Encounter for screening for malignant neoplasm of vagina: Secondary | ICD-10-CM | POA: Diagnosis not present

## 2020-07-31 DIAGNOSIS — Z01419 Encounter for gynecological examination (general) (routine) without abnormal findings: Secondary | ICD-10-CM

## 2020-07-31 DIAGNOSIS — R350 Frequency of micturition: Secondary | ICD-10-CM

## 2020-07-31 DIAGNOSIS — Z90710 Acquired absence of both cervix and uterus: Secondary | ICD-10-CM | POA: Diagnosis not present

## 2020-07-31 NOTE — Progress Notes (Signed)
Virginia Marquez [birth year] [id number]   History:    44 y.o. G4P2A2L2 Single.  Son is 11 and daughter is 11+++.  RP:  Established patient presenting for annual gyn exam   HPI: S/P LAVH/Bilateral Salpingectomy 09/2019. No pelvic pain.  Not currently sexually active. Urine with some frequency and discomfort.  BMs normal.  Breasts normal.  BMI 27.67.  Good fitness and healthy nutrition.  Health labs with [facility].  Past medical history,surgical history, family history and social history were all reviewed and documented in the EPIC chart.  Gynecologic History No LMP recorded. Patient has had a hysterectomy.  Obstetric History OB History  Gravida Para Term Preterm AB Living  4 2     2 2   SAB TAB Ectopic Multiple Live Births  2            # Outcome Date GA Lbr Len/2nd Weight Sex Delivery Anes PTL Lv  4 SAB           3 SAB           2 Para           1 Para              ROS: A ROS was performed and pertinent positives and negatives are included in the history.  GENERAL: No fevers or chills. HEENT: No change in vision, no earache, sore throat or sinus congestion. NECK: No pain or stiffness. CARDIOVASCULAR: No chest pain or pressure. No palpitations. PULMONARY: No shortness of breath, cough or wheeze. GASTROINTESTINAL: No abdominal pain, nausea, vomiting or diarrhea, melena or bright red blood per rectum. GENITOURINARY: No urinary frequency, urgency, hesitancy or dysuria. MUSCULOSKELETAL: No joint or muscle pain, no back pain, no recent trauma. DERMATOLOGIC: No rash, no itching, no lesions. ENDOCRINE: No polyuria, polydipsia, no heat or cold intolerance. No recent change in weight. HEMATOLOGICAL: No anemia or easy bruising or bleeding. NEUROLOGIC: No headache, seizures, numbness, tingling or weakness. PSYCHIATRIC: No depression, no loss of interest in normal activity or change in sleep pattern.     Exam:   BP 110/70   Ht 5\' 8"  (1.727 m)   Wt 182 lb (82.6 kg)   BMI 27.67 kg/m   Body  mass index is 27.67 kg/m.  General appearance : Well developed well nourished female. No acute distress HEENT: Eyes: no retinal hemorrhage or exudates,  Neck supple, trachea midline, no carotid bruits, no thyroidmegaly Lungs: Clear to auscultation, no rhonchi or wheezes, or rib retractions  Heart: Regular rate and rhythm, no murmurs or gallops Breast:Examined in sitting and supine position were symmetrical in appearance, no palpable masses or tenderness,  no skin retraction, no nipple inversion, no nipple discharge, no skin discoloration, no axillary or supraclavicular lymphadenopathy Abdomen: no palpable masses or tenderness, no rebound or guarding Extremities: no edema or skin discoloration or tenderness  Pelvic: Vulva: Normal             Vagina: No gross lesions or discharge.  Pap reflex done at vaginal vault.  Cervix/Uterus absent  Adnexa  Without masses or tenderness  Anus: Normal  U/A: Yellow clear, protein negative, nitrites negative, white blood cells 0-5, red blood cells negative, bacteria few.  Urine culture pending.   Assessment/Plan:  44 y.o. female for annual exam   1. Encounter for Papanicolaou smear of vagina as part of routine gynecological examination Gynecologic exam status post LAVH.  Given the history of severe dysplasia, will do a Pap test on the vaginal  vault this year.  Breast exam normal.  Screening mammogram in December 2020 was negative.  Health labs with family PA.  Body mass index 27.67.  Good muscle mass.  Good fitness with running and weightlifting.  Continue with healthy nutrition.  2. S/P laparoscopic assisted vaginal hysterectomy (LAVH)  3. Frequent urination Urine analysis very mildly perturbed.  Will wait on urine culture to decide if treatment needed. - Urinalysis,Complete w/RFL Culture  Genia Del MD, 9:27 AM 07/31/2020

## 2020-07-31 NOTE — Addendum Note (Signed)
Addended by: Berna Spare A on: 07/31/2020 10:45 AM   Modules accepted: Orders

## 2020-08-02 LAB — URINALYSIS, COMPLETE W/RFL CULTURE
Bilirubin Urine: NEGATIVE
Glucose, UA: NEGATIVE
Hgb urine dipstick: NEGATIVE
Hyaline Cast: NONE SEEN /LPF
Ketones, ur: NEGATIVE
Leukocyte Esterase: NEGATIVE
Nitrites, Initial: NEGATIVE
Protein, ur: NEGATIVE
RBC / HPF: NONE SEEN /HPF (ref 0–2)
Specific Gravity, Urine: 1.01 (ref 1.001–1.03)
pH: 6 (ref 5.0–8.0)

## 2020-08-02 LAB — URINE CULTURE
MICRO NUMBER:: 10847471
SPECIMEN QUALITY:: ADEQUATE

## 2020-08-02 LAB — CULTURE INDICATED

## 2020-08-07 LAB — PAP IG W/ RFLX HPV ASCU

## 2020-08-07 LAB — HUMAN PAPILLOMAVIRUS, HIGH RISK: HPV DNA High Risk: DETECTED — AB

## 2020-08-11 NOTE — Telephone Encounter (Signed)
I called and spoke with patient about result/recommendation. Appt scheduled for colpo.

## 2020-08-25 ENCOUNTER — Ambulatory Visit: Payer: 59 | Admitting: Obstetrics & Gynecology

## 2020-08-25 ENCOUNTER — Encounter: Payer: Self-pay | Admitting: Obstetrics & Gynecology

## 2020-08-25 ENCOUNTER — Other Ambulatory Visit: Payer: Self-pay

## 2020-08-25 VITALS — BP 126/84

## 2020-08-25 DIAGNOSIS — R87620 Atypical squamous cells of undetermined significance on cytologic smear of vagina (ASC-US): Secondary | ICD-10-CM

## 2020-08-25 DIAGNOSIS — D069 Carcinoma in situ of cervix, unspecified: Secondary | ICD-10-CM

## 2020-08-25 DIAGNOSIS — R87811 Vaginal high risk human papillomavirus (HPV) DNA test positive: Secondary | ICD-10-CM | POA: Diagnosis not present

## 2020-08-25 DIAGNOSIS — Z90710 Acquired absence of both cervix and uterus: Secondary | ICD-10-CM

## 2020-08-25 DIAGNOSIS — Z8541 Personal history of malignant neoplasm of cervix uteri: Secondary | ICD-10-CM

## 2020-08-25 NOTE — Progress Notes (Signed)
    Virginia Marquez 1976/08/05 [id number]        44 y.o.  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  Pap ASCUS/HPV HR pos 07/31/2020.     OB History  Gravida Para Term Preterm AB Living  4 2     2 2   SAB TAB Ectopic Multiple Live Births  2            # Outcome Date GA Lbr Len/2nd Weight Sex Delivery Anes PTL Lv  4 SAB           3 SAB           2 Para           1 Para             Past medical history,surgical history, problem list, medications, allergies, family history and social history were all reviewed and documented in the EPIC chart.   Directed ROS with pertinent positives and negatives documented in the history of present illness/assessment and plan.  Exam:  Vitals:   08/25/20 1158  BP: 126/84   General appearance:  Normal  Colposcopy Procedure Note SAMYAH BILBO 08/25/2020  Indications: ASCUS/HPV HR pos/H/O HPV 16 positive  Procedure Details  The risks and benefits of the procedure and Verbal informed consent obtained.  Speculum placed in vagina and excellent visualization of the vaginal vault which was swabbed x 3 with acetic acid solution.  Findings:  Cervix colposcopy: Absent  Vaginal colposcopy:  AW/punctation at mid vaginal vault and left superior vaginal vault.  Bxs were taken at each location after [facility] spray.    Vulvar colposcopy: Normal  Perirectal colposcopy: Normal  The vaginal vault was sprayed with Hurricane before performing the vaginal vault biopsies.  Specimens: Biopsies x 2.  Silver Nitrate for hemostasis.  Complications: None . Plan:  Management per Bx results   Assessment/Plan:  44 y.o. 55   1. 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  ASCUS/HPV HR pos 07/31/2020.  Colposcopy with vaginal vault Bxs (2).  Colpo findings  reviewed.  Post procedure precautions reviewed.  Management per biopsy results.  2. S/P laparoscopic assisted vaginal hysterectomy (LAVH)  3. Severe cervical dysplasia, histologically confirmed Status post LAVH.  Other orders - Pathology Report ([facility])  08/02/2020 MD, 12:18 PM 08/25/2020

## 2020-08-27 LAB — TISSUE PATH REPORT

## 2020-08-27 LAB — PATHOLOGY REPORT

## 2020-10-28 ENCOUNTER — Other Ambulatory Visit: Payer: Self-pay | Admitting: Obstetrics & Gynecology

## 2020-10-28 DIAGNOSIS — Z1231 Encounter for screening mammogram for malignant neoplasm of breast: Secondary | ICD-10-CM

## 2020-12-16 ENCOUNTER — Ambulatory Visit
Admission: RE | Admit: 2020-12-16 | Discharge: 2020-12-16 | Disposition: A | Discharge status: Home / Self Care | Payer: 59 | Source: Ambulatory Visit | Attending: Obstetrics & Gynecology | Admitting: Obstetrics & Gynecology

## 2020-12-16 ENCOUNTER — Other Ambulatory Visit: Payer: Self-pay

## 2020-12-16 DIAGNOSIS — Z1231 Encounter for screening mammogram for malignant neoplasm of breast: Secondary | ICD-10-CM

## 2021-01-12 IMAGING — MG DIGITAL SCREENING BILAT W/ TOMO W/ CAD
8 series · 8 of 24 positions shown · non-contrast
Comparison: Previous exam(s).

CLINICAL DATA: Screening.

EXAM:
DIGITAL SCREENING BILATERAL MAMMOGRAM WITH TOMO AND CAD

[R MLO synth-2D]
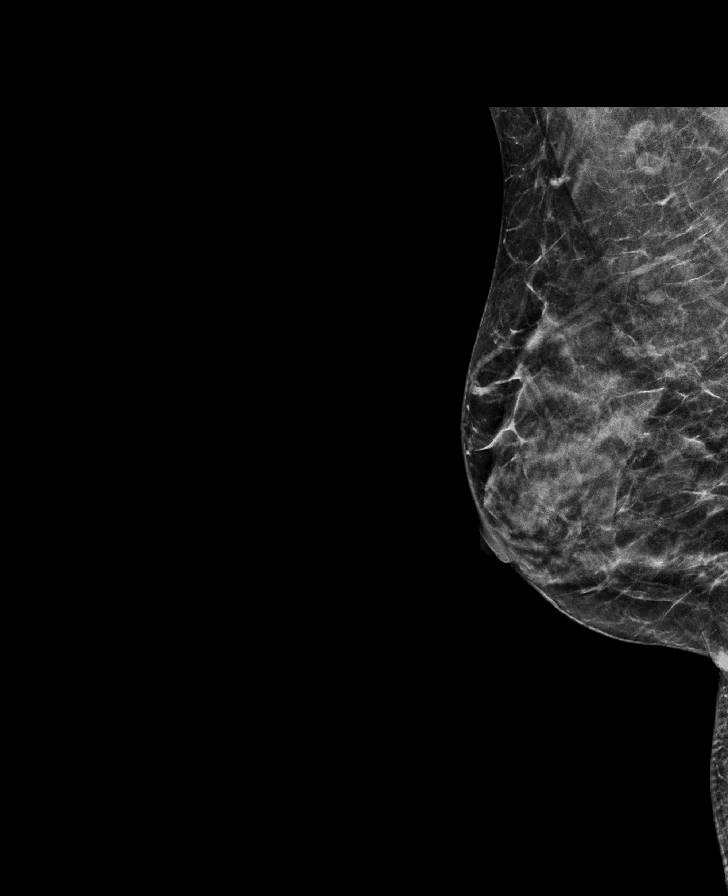

[L MLO synth-2D]
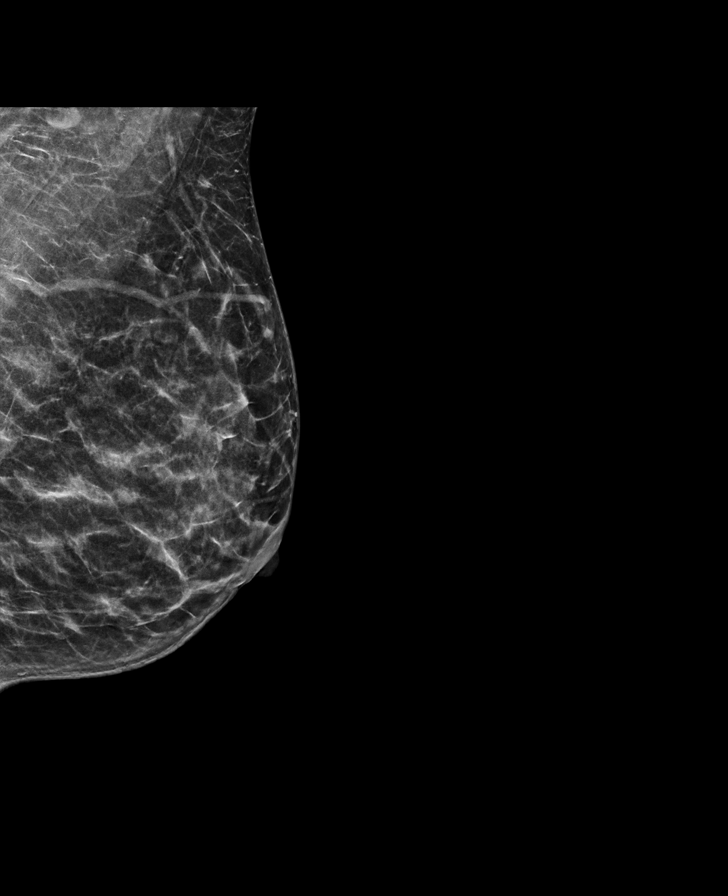

[L CC synth-2D]
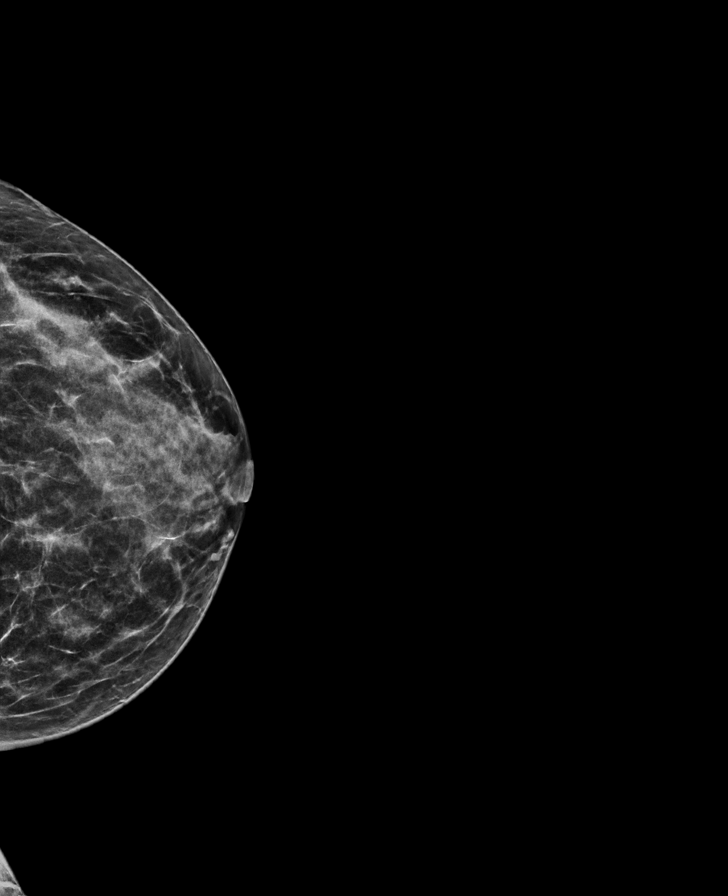

[R CC synth-2D]
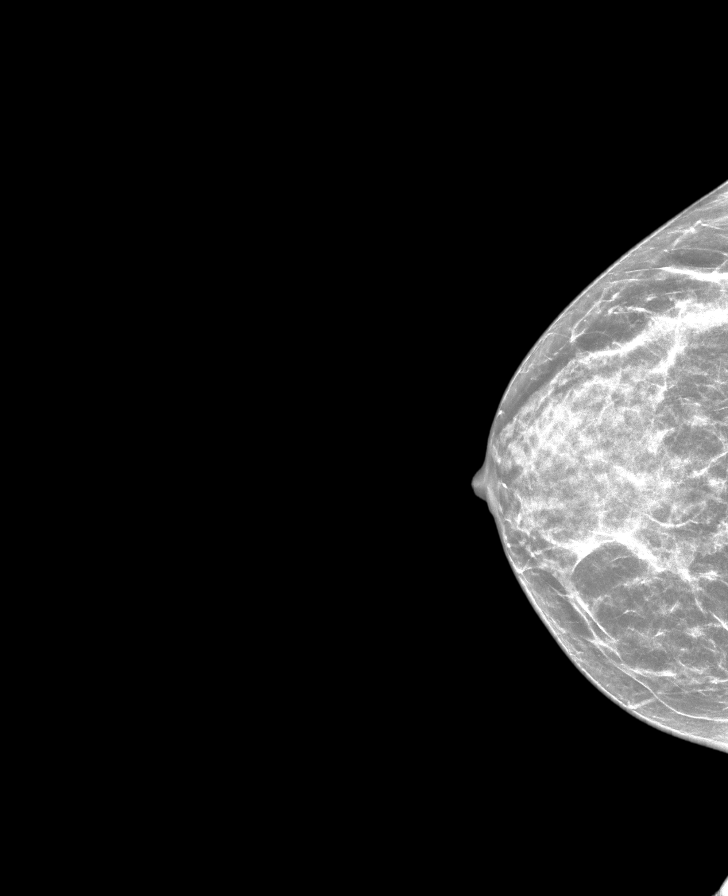

[R MLO tomo · tomo slice 29/57.0]
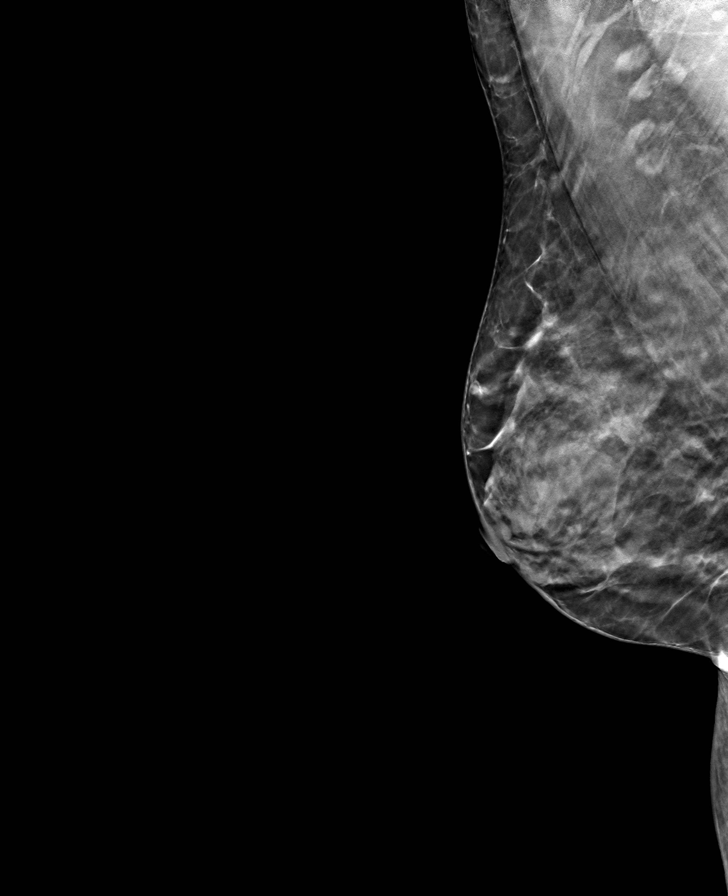

[L CC tomo · tomo slice 27/54.0]
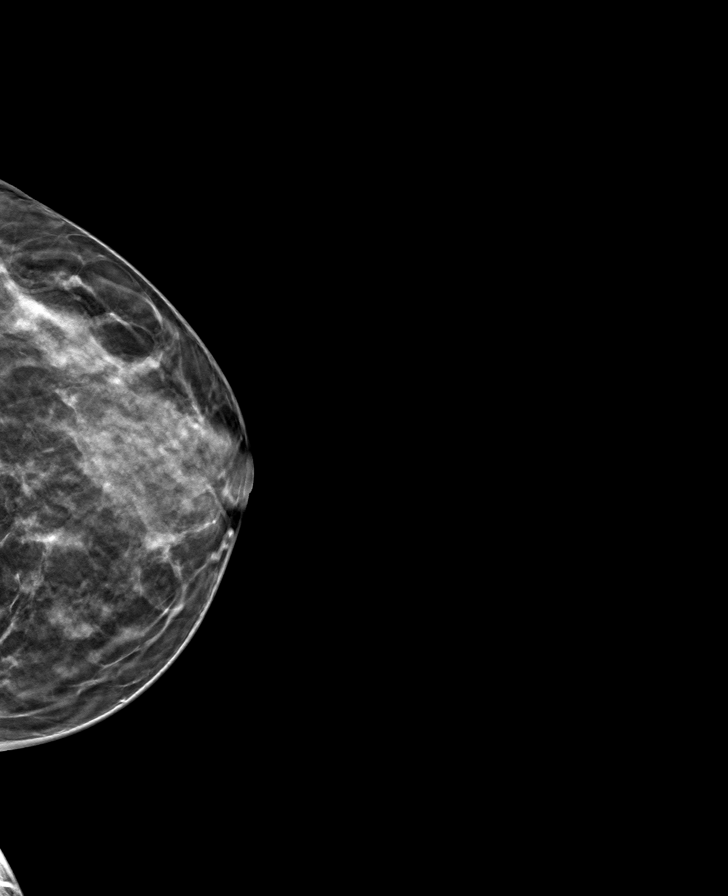

[R CC tomo · tomo slice 25/50.0]
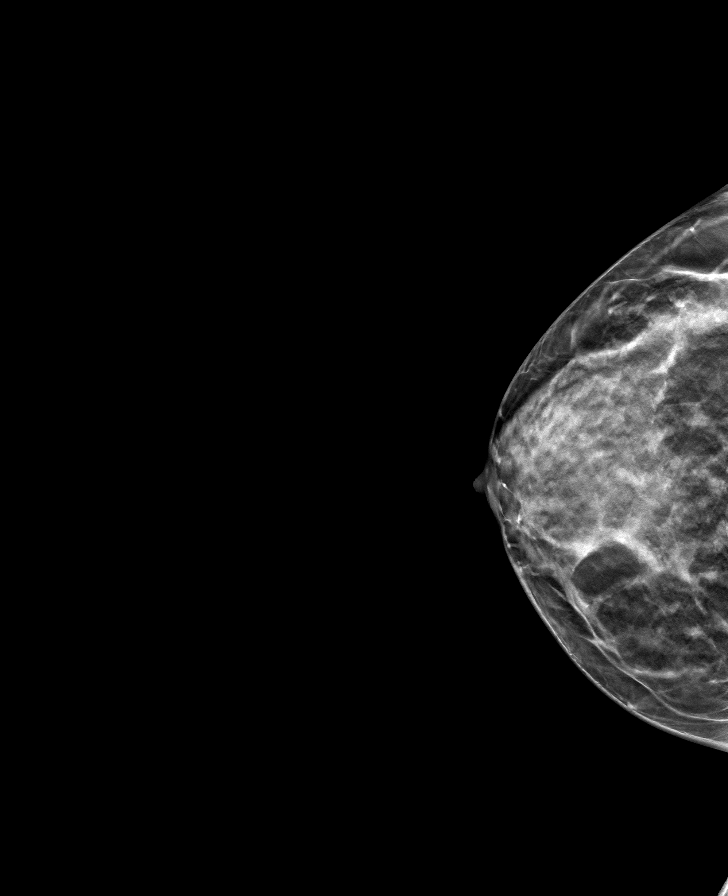

[L MLO tomo · tomo slice 28/55.0]
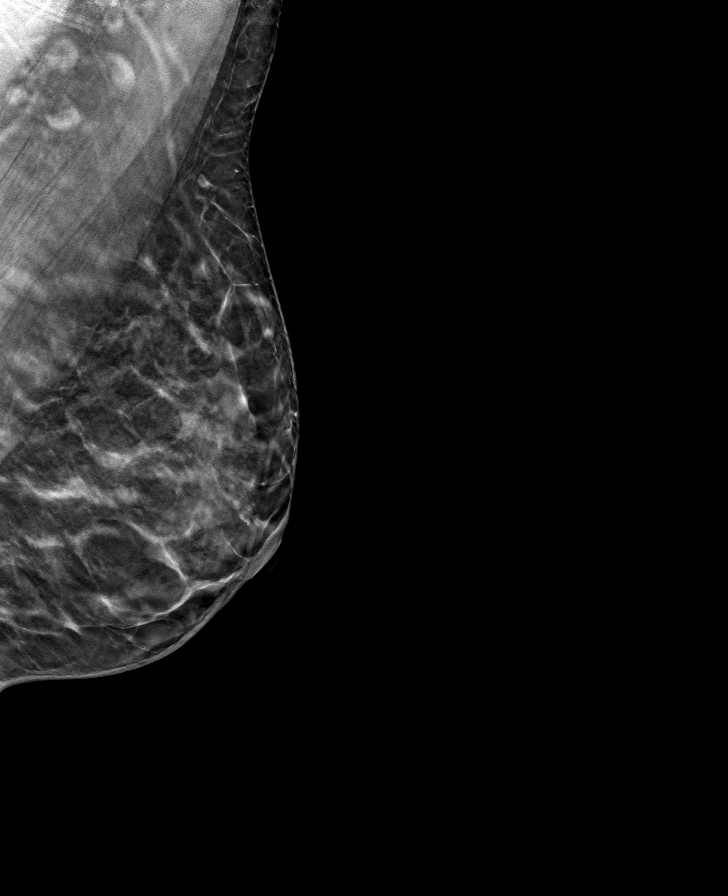

[8 of 24 positions shown; findings below may reference images not displayed]

ACR Breast Density Category c: The breast tissue is heterogeneously
dense, which may obscure small masses.
FINDINGS: There are no findings suspicious for malignancy. Images were
processed with CAD.
IMPRESSION: No mammographic evidence of malignancy. A result letter of this
screening mammogram will be mailed directly to the patient.

RECOMMENDATION:
Screening mammogram in one year. (Code:[id number]-[id number])

BI-RADS CATEGORY  1: Negative.

## 2021-01-30 ENCOUNTER — Other Ambulatory Visit: Payer: Self-pay

## 2021-01-30 ENCOUNTER — Ambulatory Visit: Payer: 59 | Admitting: Obstetrics & Gynecology

## 2021-01-30 ENCOUNTER — Encounter: Payer: Self-pay | Admitting: Obstetrics & Gynecology

## 2021-01-30 VITALS — BP 120/80

## 2021-01-30 DIAGNOSIS — N644 Mastodynia: Secondary | ICD-10-CM | POA: Diagnosis not present

## 2021-01-30 DIAGNOSIS — N6323 Unspecified lump in the left breast, lower outer quadrant: Secondary | ICD-10-CM | POA: Diagnosis not present

## 2021-01-30 NOTE — Progress Notes (Signed)
    Virginia Marquez 11-23-1976 [id number]        45 y.o.  [id number]   RP: Left breast pain and lump  HPI:  Sudden left breast pain x a few days, felt a lump at the painful site yesterday evening.  Screening mammo neg 12/2020.   OB History  Gravida Para Term Preterm AB Living  4 2     2 2   SAB IAB Ectopic Multiple Live Births  2            # Outcome Date GA Lbr Len/2nd Weight Sex Delivery Anes PTL Lv  4 SAB           3 SAB           2 Para           1 Para             Past medical history,surgical history, problem list, medications, allergies, family history and social history were all reviewed and documented in the EPIC chart.   Directed ROS with pertinent positives and negatives documented in the history of present illness/assessment and plan.  Exam:  Vitals:   01/30/21 1334  BP: 120/80   General appearance:  Normal  Breast exam:  Left breast nodule at 4 O'Clock:  2 x 2 cm tender, soft, mobile.   Assessment/Plan:  45 y.o. 55   1. Breast lump on left side at 4 o'clock position Sudden appearance of a tender lump which is felt on breast exam at 4 O'Clock on the Left breast, measured at 2 x 2 cm.  Probable benign breast cyst.  R/O more significant pathology including cancer with a Left Dx mammo/Breast [id number].  Patient agrees with plan.  2. Breast pain, left As above.  Other orders - levocetirizine (XYZAL) 5 MG tablet; Take 5 mg by mouth every evening.  [place] MD, 2:00 PM 01/30/2021

## 2021-02-03 ENCOUNTER — Telehealth: Payer: Self-pay | Admitting: *Deleted

## 2021-02-03 DIAGNOSIS — N6323 Unspecified lump in the left breast, lower outer quadrant: Secondary | ICD-10-CM

## 2021-02-03 DIAGNOSIS — N644 Mastodynia: Secondary | ICD-10-CM

## 2021-02-03 NOTE — Telephone Encounter (Signed)
Patient scheduled on 03/18/21 @ 9:45am at the breast center of [place]. Patient provided with time and date to check for cancellations.

## 2021-02-03 NOTE — Telephone Encounter (Signed)
-----   Message from Genia Del, MD sent at 01/30/2021  1:51 PM EST ----- Regarding: Refer for Lt Dx Mammo and Lt Breast US Left painful breast lump which appeared suddenly. Breast exam:  2 x 2 cm , mobile, soft, tender.  No erythema.  No LN felt.  Probable cyst, r/o more significant pathology with a Lt Dx mammo/US.

## 2021-02-07 ENCOUNTER — Encounter: Payer: Self-pay | Admitting: Obstetrics & Gynecology

## 2021-02-18 ENCOUNTER — Ambulatory Visit
Admission: RE | Admit: 2021-02-18 | Discharge: 2021-02-18 | Disposition: A | Discharge status: Home / Self Care | Payer: 59 | Source: Ambulatory Visit | Attending: Obstetrics & Gynecology | Admitting: Obstetrics & Gynecology

## 2021-02-18 ENCOUNTER — Other Ambulatory Visit: Payer: Self-pay

## 2021-02-18 DIAGNOSIS — N644 Mastodynia: Secondary | ICD-10-CM

## 2021-03-18 ENCOUNTER — Other Ambulatory Visit: Payer: 59

## 2021-07-27 ENCOUNTER — Telehealth: Payer: Self-pay | Admitting: *Deleted

## 2021-07-27 NOTE — Telephone Encounter (Signed)
Pt complains of possible UTI. Symptoms include: dysuria, urinary frequency, lower abdominal pain, cloudy urine. No appointments available today. Patient advised to go to urgent care or wait until we have a opening.Patient states she will go to urgent care so symptoms wont get worse.

## 2021-08-03 ENCOUNTER — Other Ambulatory Visit (HOSPITAL_COMMUNITY)
Admission: RE | Admit: 2021-08-03 | Discharge: 2021-08-03 | Disposition: A | Discharge status: Home / Self Care | Payer: 59 | Source: Ambulatory Visit | Attending: Obstetrics & Gynecology | Admitting: Obstetrics & Gynecology

## 2021-08-03 ENCOUNTER — Ambulatory Visit (INDEPENDENT_AMBULATORY_CARE_PROVIDER_SITE_OTHER): Payer: 59 | Admitting: Obstetrics & Gynecology

## 2021-08-03 ENCOUNTER — Encounter: Payer: Self-pay | Admitting: Obstetrics & Gynecology

## 2021-08-03 ENCOUNTER — Other Ambulatory Visit: Payer: Self-pay

## 2021-08-03 VITALS — BP 116/82 | HR 68 | Resp 14 | Ht 67.75 in | Wt 183.2 lb

## 2021-08-03 DIAGNOSIS — Z90710 Acquired absence of both cervix and uterus: Secondary | ICD-10-CM

## 2021-08-03 DIAGNOSIS — Z1272 Encounter for screening for malignant neoplasm of vagina: Secondary | ICD-10-CM | POA: Insufficient documentation

## 2021-08-03 DIAGNOSIS — Z01419 Encounter for gynecological examination (general) (routine) without abnormal findings: Secondary | ICD-10-CM

## 2021-08-03 DIAGNOSIS — D069 Carcinoma in situ of cervix, unspecified: Secondary | ICD-10-CM

## 2021-08-03 DIAGNOSIS — R87810 Cervical high risk human papillomavirus (HPV) DNA test positive: Secondary | ICD-10-CM | POA: Insufficient documentation

## 2021-08-03 NOTE — Progress Notes (Signed)
Virginia Marquez 1976/10/25 [id number]   History:    45 y.o. G4P2A2L2 Single.  Son is 61, driving and daughter is almost 33 yo.   RP:  Established patient presenting for annual gyn exam    HPI: S/P LAVH/Bilateral Salpingectomy 09/2019. 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  Colpo 08/2020 not reaching dysplasia.  No pelvic pain.  Not currently sexually active.  Some vaginal d/c, but no odor.  U/A Neg recently.  BMs normal.  Breasts normal.  BMI 28.07.  Good fitness and healthy nutrition.  Health labs with [facility].   Past medical history,surgical history, family history and social history were all reviewed and documented in the EPIC chart.  Gynecologic History No LMP recorded. Patient has had a hysterectomy.  Obstetric History OB History  Gravida Para Term Preterm AB Living  4 2     2 2   SAB IAB Ectopic Multiple Live Births  2            # Outcome Date GA Lbr Len/2nd Weight Sex Delivery Anes PTL Lv  4 SAB           3 SAB           2 Para           1 Para              ROS: A ROS was performed and pertinent positives and negatives are included in the history.  GENERAL: No fevers or chills. HEENT: No change in vision, no earache, sore throat or sinus congestion. NECK: No pain or stiffness. CARDIOVASCULAR: No chest pain or pressure. No palpitations. PULMONARY: No shortness of breath, cough or wheeze. GASTROINTESTINAL: No abdominal pain, nausea, vomiting or diarrhea, melena or bright red blood per rectum. GENITOURINARY: No urinary frequency, urgency, hesitancy or dysuria. MUSCULOSKELETAL: No joint or muscle pain, no back pain, no recent trauma. DERMATOLOGIC: No rash, no itching, no lesions. ENDOCRINE: No polyuria, polydipsia, no heat or cold intolerance. No recent change in weight. HEMATOLOGICAL: No anemia or easy bruising or bleeding. NEUROLOGIC: No headache, seizures, numbness, tingling or weakness. PSYCHIATRIC: No depression, no loss of interest in normal activity or change in sleep pattern.      Exam:   BP 116/82 (BP Location: Right Arm, Patient Position: Sitting, Cuff Size: Normal)   Pulse 68   Resp 14   Ht 5' 7.75" (1.721 m)   Wt 183 lb 4 oz (83.1 kg)   BMI 28.07 kg/m   Body mass index is 28.07 kg/m.  General appearance : Well developed well nourished female. No acute distress HEENT: Eyes: no retinal hemorrhage or exudates,  Neck supple, trachea midline, no carotid bruits, no thyroidmegaly Lungs: Clear to auscultation, no rhonchi or wheezes, or rib retractions  Heart: Regular rate and rhythm, no murmurs or gallops Breast:Examined in sitting and supine position were symmetrical in appearance, no palpable masses or tenderness,  no skin retraction, no nipple inversion, no nipple discharge, no skin discoloration, no axillary or supraclavicular lymphadenopathy Abdomen: no palpable masses or tenderness, no rebound or guarding Extremities: no edema or skin discoloration or tenderness  Pelvic: Vulva: Normal             Vagina: No gross lesions or discharge.  Pap reflex done.  Cervix/Uterus absent  Adnexa  Without masses or tenderness  Anus: Normal   Assessment/Plan:  45 y.o. female for annual exam   1. Encounter for Papanicolaou smear of vagina as part of routine gynecological examination Gynecologic exam  status post LAVH.  Pap test with high-risk HPV done on the vaginal vault.  Breast exam normal.  Bilateral screening mammogram was negative in January 2022.  Patient went back for a left diagnostic mammogram and ultrasound which was benign in March 2022.  Colonoscopy April 2022.  Health labs with family PA.  Body mass index 28.08.  Continue with fitness and healthy nutrition. - Cytology - PAP( [place])  2. Severe cervical dysplasia, histologically confirmed H/O CIN 2 s/p LAVH. - Cytology - PAP( [place])  3. 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 - Cytology - PAP( [place])  4. S/P laparoscopic assisted vaginal  hysterectomy (LAVH)  Other orders - Multiple Vitamin (MULTIVITAMIN PO); Take by mouth. - BIOTIN PO; Take by mouth. - Cholecalciferol (VITAMIN D3 PO); Take by mouth.   Genia Del MD, 8:37 AM 08/03/2021

## 2021-08-04 LAB — CYTOLOGY - PAP
Comment: NEGATIVE
Diagnosis: NEGATIVE
High risk HPV: POSITIVE — AB

## 2021-08-24 NOTE — Telephone Encounter (Signed)
RX request for Valtrex  Last annual 08/03/21 Per patient last prescription expired ( I did not locate previous dose in Epic) I will route this to Provider for approval

## 2021-08-25 MED ORDER — VALACYCLOVIR HCL 1 G PO TABS: 1000.0000 mg | ORAL_TABLET | Freq: Two times a day (BID) | ORAL | 5 refills | Status: DC

## 2021-08-25 NOTE — Telephone Encounter (Signed)
Genia Del, MD  You 7 minutes ago (11:19 AM)   Agree with Valacyclovir 1 g PO BID x 5 days.  #30  Refill x 5.

## 2021-09-23 ENCOUNTER — Other Ambulatory Visit: Payer: Self-pay

## 2021-09-23 MED ORDER — VALACYCLOVIR HCL 1 G PO TABS: 1000.0000 mg | ORAL_TABLET | Freq: Two times a day (BID) | ORAL | 1 refills | Status: AC

## 2021-09-23 NOTE — Telephone Encounter (Signed)
Needs to get Rx through mail order pharmacy with 90 days supply.

## 2022-03-22 IMAGING — US US BREAST*L* LIMITED INC AXILLA
1 series · 2 of 2 positions shown · non-contrast
Comparison: Previous exam(s).

CLINICAL DATA: Patient describes a tender lump within the outer
LEFT breast, sudden onset approximately 1 week ago. Ordering
physician describes a possible lump in the same area. Patient states
that the palpable findings have decreased over the past week.

EXAM:
DIGITAL DIAGNOSTIC UNILATERAL LEFT MAMMOGRAM WITH TOMOSYNTHESIS AND
CAD; ULTRASOUND LEFT BREAST LIMITED
TECHNIQUE: Left digital diagnostic mammography and breast tomosynthesis was
performed. The images were evaluated with computer-aided detection.;
Targeted ultrasound examination of the left breast was performed

[Series 1: us breast*left* limited inc axilla · 0.06mm/px · 2 of 2 slices shown]
[im 1/2]
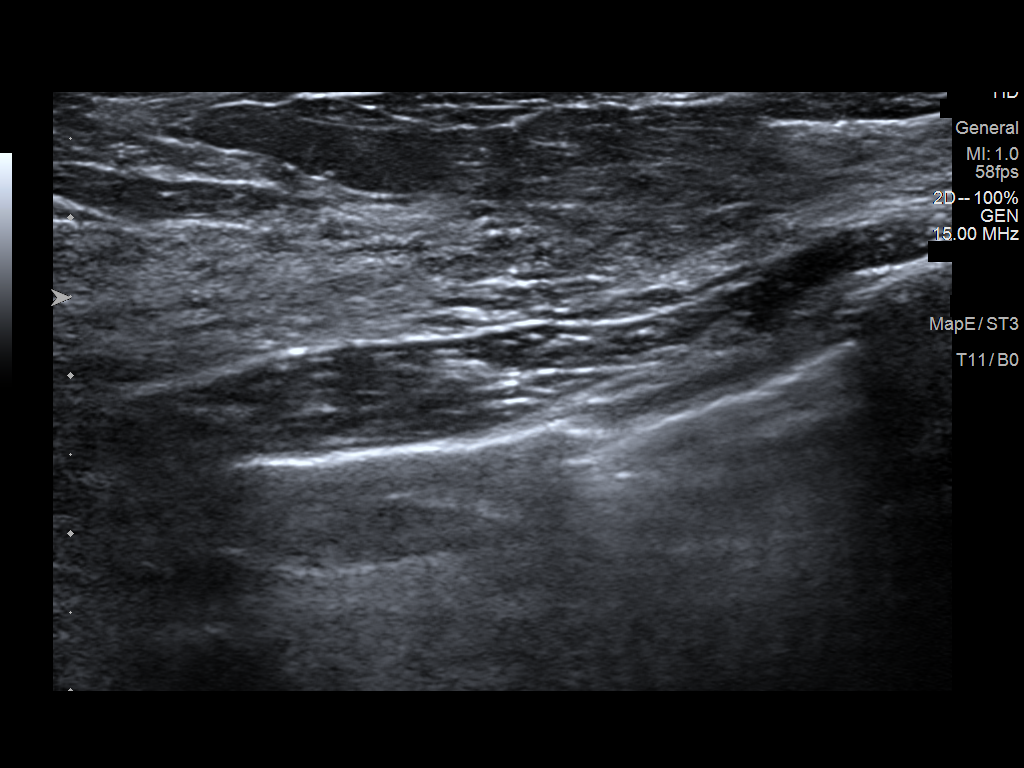
[im 2/2]
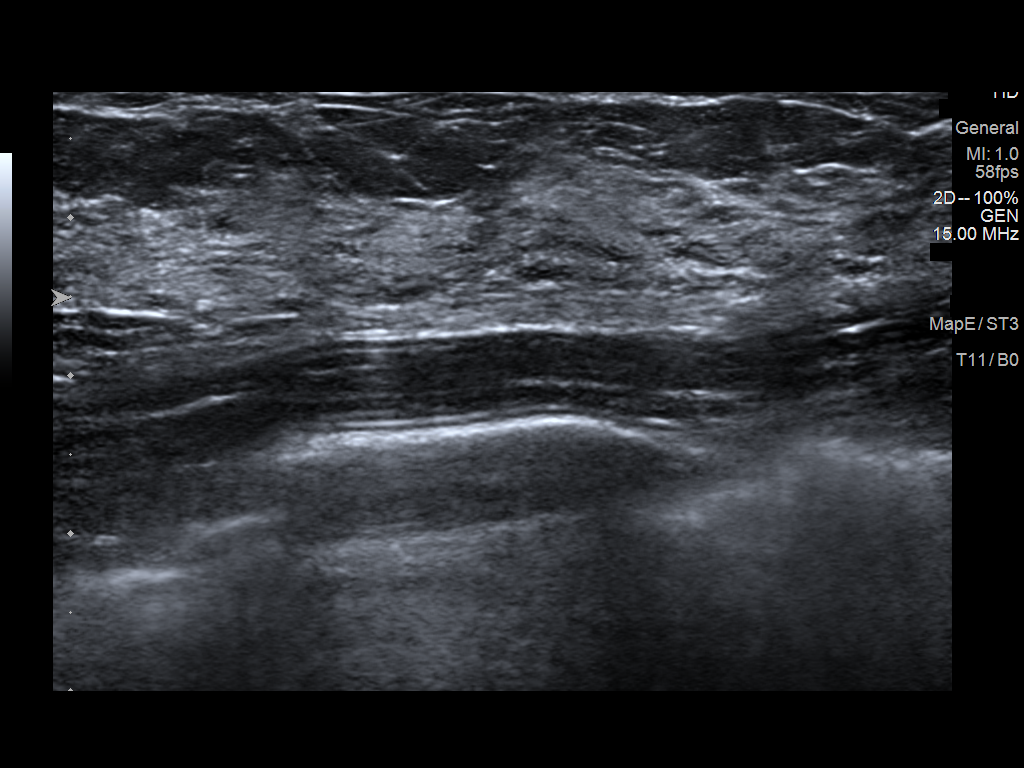

[2 of 2 positions shown; findings below may reference images not displayed]

ACR Breast Density Category c: The breast tissue is heterogeneously
dense, which may obscure small masses.
FINDINGS: LEFT breast diagnostic mammogram: On today's diagnostic views,
including spot compression with 3D tomosynthesis, there are no new
dominant masses, suspicious calcifications or secondary signs of
malignancy identified in the LEFT breast. Specifically, there is no
mammographic abnormality within the outer LEFT breast, corresponding
to the area of clinical concern with overlying skin marker in place.

Targeted ultrasound is performed, evaluating the outer LEFT breast
with particular attention to the 3-4 o'clock axis as directed by the
patient, showing only normal fibroglandular tissues and fat lobules
throughout. No solid or cystic mass. There is a ridge of normal
dense fibroglandular tissue within the outer LEFT breast,
corresponding to the area of clinical concern.
IMPRESSION: No evidence of malignancy. Ridge of normal dense fibroglandular
tissue within the outer LEFT breast, corresponding to the area of
clinical concern.

RECOMMENDATION:
Screening mammogram in one year.(Code:PO-K-72D)

The patient was instructed to return sooner if a new palpable
abnormality is identified in either breast.

I have discussed the findings and recommendations with the patient.
If applicable, a reminder letter will be sent to the patient
regarding the next appointment.

BI-RADS CATEGORY  2: Benign.

## 2022-06-10 ENCOUNTER — Other Ambulatory Visit: Payer: Self-pay

## 2022-06-10 ENCOUNTER — Encounter (HOSPITAL_BASED_OUTPATIENT_CLINIC_OR_DEPARTMENT_OTHER): Payer: Self-pay | Admitting: Emergency Medicine

## 2022-06-10 ENCOUNTER — Emergency Department (HOSPITAL_BASED_OUTPATIENT_CLINIC_OR_DEPARTMENT_OTHER): Payer: Managed Care, Other (non HMO)

## 2022-06-10 ENCOUNTER — Emergency Department (HOSPITAL_BASED_OUTPATIENT_CLINIC_OR_DEPARTMENT_OTHER)
Admission: EM | Admit: 2022-06-10 | Discharge: 2022-06-10 | Disposition: A | Discharge status: Home / Self Care | Payer: Managed Care, Other (non HMO) | Attending: Emergency Medicine | Admitting: Emergency Medicine

## 2022-06-10 DIAGNOSIS — Z79899 Other long term (current) drug therapy: Secondary | ICD-10-CM | POA: Insufficient documentation

## 2022-06-10 DIAGNOSIS — R109 Unspecified abdominal pain: Secondary | ICD-10-CM | POA: Diagnosis present

## 2022-06-10 DIAGNOSIS — E039 Hypothyroidism, unspecified: Secondary | ICD-10-CM | POA: Diagnosis not present

## 2022-06-10 DIAGNOSIS — N3000 Acute cystitis without hematuria: Secondary | ICD-10-CM | POA: Insufficient documentation

## 2022-06-10 LAB — CBC WITH DIFFERENTIAL/PLATELET
Abs Immature Granulocytes: 0.03 10*3/uL (ref 0.00–0.07)
Basophils Absolute: 0 10*3/uL (ref 0.0–0.1)
Basophils Relative: 0 %
Eosinophils Absolute: 0.2 10*3/uL (ref 0.0–0.5)
Eosinophils Relative: 2 %
HCT: 36.6 % (ref 36.0–46.0)
Hemoglobin: 12.8 g/dL (ref 12.0–15.0)
Immature Granulocytes: 0 %
Lymphocytes Relative: 25 %
Lymphs Abs: 2.3 10*3/uL (ref 0.7–4.0)
MCH: 32.2 pg (ref 26.0–34.0)
MCHC: 35 g/dL (ref 30.0–36.0)
MCV: 92.2 fL (ref 80.0–100.0)
Monocytes Absolute: 0.8 10*3/uL (ref 0.1–1.0)
Monocytes Relative: 9 %
Neutro Abs: 6 10*3/uL (ref 1.7–7.7)
Neutrophils Relative %: 64 %
Platelets: 219 10*3/uL (ref 150–400)
RBC: 3.97 MIL/uL (ref 3.87–5.11)
RDW: 11.5 % (ref 11.5–15.5)
WBC: 9.4 10*3/uL (ref 4.0–10.5)
nRBC: 0 % (ref 0.0–0.2)

## 2022-06-10 LAB — URINALYSIS, ROUTINE W REFLEX MICROSCOPIC
Bilirubin Urine: NEGATIVE
Glucose, UA: NEGATIVE mg/dL
Ketones, ur: NEGATIVE mg/dL
Nitrite: NEGATIVE
Protein, ur: 100 mg/dL — AB
Specific Gravity, Urine: 1.02 (ref 1.005–1.030)
pH: 5.5 (ref 5.0–8.0)

## 2022-06-10 LAB — COMPREHENSIVE METABOLIC PANEL
ALT: 16 U/L (ref 0–44)
AST: 17 U/L (ref 15–41)
Albumin: 4.2 g/dL (ref 3.5–5.0)
Alkaline Phosphatase: 56 U/L (ref 38–126)
Anion gap: 6 (ref 5–15)
BUN: 15 mg/dL (ref 6–20)
CO2: 27 mmol/L (ref 22–32)
Calcium: 9.2 mg/dL (ref 8.9–10.3)
Chloride: 105 mmol/L (ref 98–111)
Creatinine, Ser: 0.87 mg/dL (ref 0.44–1.00)
GFR, Estimated: >60 mL/min (ref >60)
Glucose, Bld: 95 mg/dL (ref 70–99)
Potassium: 3.8 mmol/L (ref 3.5–5.1)
Sodium: 138 mmol/L (ref 135–145)
Total Bilirubin: 0.5 mg/dL (ref 0.3–1.2)
Total Protein: 7 g/dL (ref 6.5–8.1)

## 2022-06-10 LAB — URINALYSIS, MICROSCOPIC (REFLEX): WBC, UA: >50 WBC/hpf (ref 0–5)

## 2022-06-10 MED ORDER — KETOROLAC TROMETHAMINE 15 MG/ML IJ SOLN
15.0000 mg | Freq: Once | INTRAMUSCULAR | Status: AC
  Administered 2022-06-10: 15 mg via INTRAVENOUS
  Filled 2022-06-10: qty 1

## 2022-06-10 MED ORDER — CEFDINIR 300 MG PO CAPS: 300.0000 mg | ORAL_CAPSULE | Freq: Two times a day (BID) | ORAL | 0 refills | Status: DC

## 2022-06-10 NOTE — ED Provider Notes (Signed)
[facility] EMERGENCY DEPARTMENT Provider Note   CSN: [id number] Arrival date & time: 06/10/22  1745     History  Chief Complaint  Patient presents with   Abdominal Pain    Virginia Marquez is a 46 y.o. female.  Patient with history of hysterectomy, hypothyroidism --presents to the emergency department for evaluation of right-sided abdominal and flank pain starting on the evening 6/27.  Worsened overnight and she considered coming in for the intense pain, however symptoms did improve over the day yesterday.  This morning she felt better but early this afternoon she had acute onset of intense pain in the right side again.  No associated vomiting, diarrhea or constipation.  No dysuria, increased frequency or urgency, hematuria.  No treatments prior to arrival.       Home Medications Prior to Admission medications   Medication Sig Start Date End Date Taking? Authorizing Provider  BIOTIN PO Take by mouth.    [provider]  Cholecalciferol (VITAMIN D3 PO) Take by mouth.    [provider]  citalopram (CELEXA) 20 MG tablet Take 20 mg by mouth daily.  10/18/16   [provider]  fluticasone (FLONASE) 50 MCG/ACT nasal spray Place 2 sprays into both nostrils daily as needed for allergies or rhinitis.    [provider]  levocetirizine (XYZAL) 5 MG tablet Take 5 mg by mouth every evening.    [provider]  levothyroxine (SYNTHROID) 25 MCG tablet Take 25 mcg by mouth daily before breakfast.  03/27/19   [provider]  Multiple Vitamin (MULTIVITAMIN PO) Take by mouth.    [provider]  pantoprazole (PROTONIX) 40 MG tablet Take 40 mg by mouth daily.  10/18/16   [provider]  valACYclovir (VALTREX) 1000 MG tablet Take 1 tablet (1,000 mg total) by mouth 2 (two) times daily. For 5 days 09/23/21   Genia Del, MD      Allergies    Other and Sulfa antibiotics    Review of Systems   Review of  Systems  Physical Exam Updated Vital Signs BP 140/83   Pulse 61   Temp 98.2 F (36.8 C) (Oral)   Resp 20   Ht 5\' 8"  (1.727 m)   Wt 81.6 kg   SpO2 98%   BMI 27.37 kg/m  Physical Exam Vitals and nursing note reviewed.  Constitutional:      General: She is not in acute distress.    Appearance: She is well-developed.  HENT:     Head: Normocephalic and atraumatic.     Right Ear: External ear normal.     Left Ear: External ear normal.     Nose: Nose normal.  Eyes:     Conjunctiva/sclera: Conjunctivae normal.  Cardiovascular:     Rate and Rhythm: Normal rate and regular rhythm.     Heart sounds: No murmur heard. Pulmonary:     Effort: No respiratory distress.     Breath sounds: No wheezing, rhonchi or rales.  Abdominal:     Palpations: Abdomen is soft.     Tenderness: There is abdominal tenderness (Mild to moderate, right flank). There is no guarding or rebound.  Musculoskeletal:     Cervical back: Normal range of motion and neck supple.     Right lower leg: No edema.     Left lower leg: No edema.  Skin:    General: Skin is warm and dry.     Findings: No rash.  Neurological:  General: No focal deficit present.     Mental Status: She is alert. Mental status is at baseline.     Motor: No weakness.  Psychiatric:        Mood and Affect: Mood normal.     ED Results / Procedures / Treatments   Labs (all labs ordered are listed, but only abnormal results are displayed) Labs Reviewed  URINALYSIS, ROUTINE W REFLEX MICROSCOPIC - Abnormal; Notable for the following components:      Result Value   APPearance CLOUDY (*)    Hgb urine dipstick MODERATE (*)    Protein, ur 100 (*)    Leukocytes,Ua LARGE (*)    All other components within normal limits  URINALYSIS, MICROSCOPIC (REFLEX) - Abnormal; Notable for the following components:   Bacteria, UA FEW (*)    All other components within normal limits  CBC WITH DIFFERENTIAL/PLATELET  COMPREHENSIVE METABOLIC PANEL     EKG None  Radiology CT Renal Stone Study  Result Date: 06/10/2022 CLINICAL DATA:  Flank pain, kidney stone suspected EXAM: CT ABDOMEN AND PELVIS WITHOUT CONTRAST TECHNIQUE: Multidetector CT imaging of the abdomen and pelvis was performed following the standard protocol without IV contrast. RADIATION DOSE REDUCTION: This exam was performed according to the departmental dose-optimization program which includes automated exposure control, adjustment of the mA and/or kV according to patient size and/or use of iterative reconstruction technique. COMPARISON:  12/09/2021 FINDINGS: Lower chest: No acute abnormality Hepatobiliary: No focal hepatic abnormality. Gallbladder unremarkable. Pancreas: No focal abnormality or ductal dilatation. Spleen: No focal abnormality.  Normal size. Adrenals/Urinary Tract: No adrenal abnormality. No focal renal abnormality. No stones or hydronephrosis. Urinary bladder is unremarkable. Stomach/Bowel: Normal appendix. Stomach, large and small bowel grossly unremarkable. Vascular/Lymphatic: No evidence of aneurysm or adenopathy. Reproductive: No visible focal abnormality. Other: No free fluid or free air. Musculoskeletal: No acute bony abnormality. IMPRESSION: No renal or ureteral stones.  No hydronephrosis. No acute findings. Electronically Signed   By: Charlett Nose M.D.   On: 06/10/2022 19:07    Procedures Procedures    Medications Ordered in ED Medications  ketorolac (TORADOL) 15 MG/ML injection 15 mg (15 mg Intravenous Given 06/10/22 1823)    ED Course/ Medical Decision Making/ A&P    Patient seen and examined. History obtained directly from patient.   Labs/EKG: Ordered CBC, CMP, UA.   Imaging: Will likely need imaging, will wait to see UA results.  Medications/Fluids: Ordered: IV Toradol.   Most recent vital signs reviewed and are as follows: BP 129/71   Pulse 64   Temp 98.2 F (36.8 C) (Oral)   Resp 20   Ht 5\' 8"  (1.727 m)   Wt 81.6 kg   SpO2 100%    BMI 27.37 kg/m   Initial impression: Right flank pain, intermittent symptoms concerning for ureteral colic.  7:35 PM Reassessment performed. Patient appears comfortable.  Labs personally reviewed and interpreted including: CBC unremarkable; CMP unremarkable; UA consistent with UTI with large leukocyte esterase and greater than 50 white blood cells per high-powered field.  There is some blood as would be expected with UTI.  Imaging personally visualized and interpreted including: CT noncontrast of the abdomen pelvis, agree no acute findings, no signs of ureteral stone.  Reviewed pertinent lab work and imaging with patient at bedside. Questions answered.   Most current vital signs reviewed and are as follows: BP 129/71   Pulse 64   Temp 98.2 F (36.8 C) (Oral)   Resp 20   Ht 5\' 8"  (1.727  m)   Wt 81.6 kg   SpO2 100%   BMI 27.37 kg/m   Plan: Discharge to home.   Prescriptions written for: Cefdinir  Other home care instructions discussed: Rest, hydration, OTC meds  ED return instructions discussed: The patient was urged to return to the Emergency Department immediately with worsening of current symptoms, worsening abdominal pain, persistent vomiting, blood noted in stools, fever, or any other concerns. The patient verbalized understanding.   Follow-up instructions discussed: Patient encouraged to follow-up with their PCP in 3 days.                            Medical Decision Making Amount and/or Complexity of Data Reviewed Labs: ordered.  Risk Prescription drug management.   For this patient's complaint of abdominal pain/flank pain, the following conditions were considered on the differential diagnosis: gastritis/PUD, enteritis/duodenitis, appendicitis, cholelithiasis/cholecystitis, cholangitis, pancreatitis, ruptured viscus, colitis, diverticulitis, small/large bowel obstruction, proctitis, cystitis, pyelonephritis, ureteral colic, aortic dissection, aortic aneurysm. In  women, ectopic pregnancy, pelvic inflammatory disease, ovarian cysts, and tubo-ovarian abscess were also considered. Atypical chest etiologies were also considered including ACS, PE, and pneumonia.   Work-up revealed signs of urinary tract infection with normal white blood cell count on CBC.  Overall low concern for pyelonephritis, however patient does not have typical irritative UTI symptoms.  Pain may be related to ureteral colic from the infection?  However, she does not appear to be septic, is not actively vomiting at this time.  We will start on antibiotics and have her follow-up with PCP.  The patient's vital signs, pertinent lab work and imaging were reviewed and interpreted as discussed in the ED course. Hospitalization was considered for further testing, treatments, or serial exams/observation. However as patient is well-appearing, has a stable exam, and reassuring studies today, I do not feel that they warrant admission at this time. This plan was discussed with the patient who verbalizes agreement and comfort with this plan and seems reliable and able to return to the Emergency Department with worsening or changing symptoms.          Final Clinical Impression(s) / ED Diagnoses Final diagnoses:  Flank pain  Acute cystitis without hematuria    Rx / DC Orders ED Discharge Orders          Ordered    cefdinir (OMNICEF) 300 MG capsule  2 times daily        06/10/22 1925              Renne Crigler, PA-C 06/10/22 1938    Sloan Leiter, DO 06/12/22 0015

## 2022-06-10 NOTE — Discharge Instructions (Signed)
Please read and follow all provided instructions.  Your diagnoses today include:  1. Flank pain   2. Acute cystitis without hematuria     Tests performed today include: Urine test - suggests that you have an infection in your bladder Complete blood cell count: No problems Basic metabolic panel: No problems Vital signs. See below for your results today.   Medications prescribed:  Cefdinir: Antibiotic for urinary kidney infection  Home care instructions:  Follow any educational materials contained in this packet.  Follow-up instructions: Please follow-up with your primary care provider in 3 days if symptoms are not resolved for further evaluation of your symptoms.  Return instructions:  Please return to the Emergency Department if you experience worsening symptoms.  Return with fever, worsening pain, persistent vomiting, worsening pain in your back.  Please return if you have any other emergent concerns.  Additional Information:  Your vital signs today were: BP 129/71   Pulse 64   Temp 98.2 F (36.8 C) (Oral)   Resp 20   Ht 5\' 8"  (1.727 m)   Wt 81.6 kg   SpO2 100%   BMI 27.37 kg/m  If your blood pressure (BP) was elevated above 135/85 this visit, please have this repeated by your doctor within one month. --------------

## 2022-06-10 NOTE — ED Triage Notes (Signed)
RLQ abdominal pain since Wednesday, subsided and came back today at 3pm.  Radiates to back.  No N/V, no fever.

## 2022-08-12 ENCOUNTER — Other Ambulatory Visit (HOSPITAL_COMMUNITY)
Admission: RE | Admit: 2022-08-12 | Discharge: 2022-08-12 | Disposition: A | Discharge status: Home / Self Care | Payer: Managed Care, Other (non HMO) | Source: Ambulatory Visit | Attending: Obstetrics & Gynecology | Admitting: Obstetrics & Gynecology

## 2022-08-12 ENCOUNTER — Encounter: Payer: Self-pay | Admitting: Obstetrics & Gynecology

## 2022-08-12 ENCOUNTER — Ambulatory Visit (INDEPENDENT_AMBULATORY_CARE_PROVIDER_SITE_OTHER): Payer: Managed Care, Other (non HMO) | Admitting: Obstetrics & Gynecology

## 2022-08-12 VITALS — BP 116/64 | HR 56 | Ht 67.75 in | Wt 187.0 lb

## 2022-08-12 DIAGNOSIS — Z1272 Encounter for screening for malignant neoplasm of vagina: Secondary | ICD-10-CM | POA: Insufficient documentation

## 2022-08-12 DIAGNOSIS — Z90710 Acquired absence of both cervix and uterus: Secondary | ICD-10-CM | POA: Diagnosis not present

## 2022-08-12 DIAGNOSIS — Z01419 Encounter for gynecological examination (general) (routine) without abnormal findings: Secondary | ICD-10-CM | POA: Diagnosis present

## 2022-08-12 DIAGNOSIS — D069 Carcinoma in situ of cervix, unspecified: Secondary | ICD-10-CM

## 2022-08-12 NOTE — Progress Notes (Signed)
Virginia Marquez [birth year] [id number]   History:    46 y.o.  G4P2A2L2 Single.  Son is 52, driving and daughter is almost 22 yo.   RP:  Established patient presenting for annual gyn exam    HPI: S/P LAVH/Bilateral Salpingectomy 09/2019. 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  Colpo 08/2020 not reaching dysplasia.  Pap Neg 07/2021.  Pap reflex today.  No pelvic pain.  Not currently sexually active.  Urine normal. BMs normal.  Breasts normal.  Screening mammo done 07/3022 pending results.  BMI 28.07.  Good fitness and healthy nutrition.  Health labs with [facility].  Started Col[facility] at age 69 because of mother with Colon Ca.  COLONOSCOPY: 03-25-21.  Past medical history,surgical history, family history and social history were all reviewed and documented in the EPIC chart.  Gynecologic History No LMP recorded. Patient has had a hysterectomy.  Obstetric History OB History  Gravida Para Term Preterm AB Living  4 2 2   2 2   SAB IAB Ectopic Multiple Live Births  2            # Outcome Date GA Lbr Len/2nd Weight Sex Delivery Anes PTL Lv  4 SAB           3 SAB           2 Term           1 Term              ROS: A ROS was performed and pertinent positives and negatives are included in the history. GENERAL: No fevers or chills. HEENT: No change in vision, no earache, sore throat or sinus congestion. NECK: No pain or stiffness. CARDIOVASCULAR: No chest pain or pressure. No palpitations. PULMONARY: No shortness of breath, cough or wheeze. GASTROINTESTINAL: No abdominal pain, nausea, vomiting or diarrhea, melena or bright red blood per rectum. GENITOURINARY: No urinary frequency, urgency, hesitancy or dysuria. MUSCULOSKELETAL: No joint or muscle pain, no back pain, no recent trauma. DERMATOLOGIC: No rash, no itching, no lesions. ENDOCRINE: No polyuria, polydipsia, no heat or cold intolerance. No recent change in weight. HEMATOLOGICAL: No anemia or easy bruising or bleeding. NEUROLOGIC: No headache, seizures, numbness,  tingling or weakness. PSYCHIATRIC: No depression, no loss of interest in normal activity or change in sleep pattern.     Exam:   BP 116/64   Pulse (!) 56   Ht 5' 7.75" (1.721 m)   Wt 187 lb (84.8 kg)   SpO2 98%   BMI 28.64 kg/m   Body mass index is 28.64 kg/m.  General appearance : Well developed well nourished female. No acute distress HEENT: Eyes: no retinal hemorrhage or exudates,  Neck supple, trachea midline, no carotid bruits, no thyroidmegaly Lungs: Clear to auscultation, no rhonchi or wheezes, or rib retractions  Heart: Regular rate and rhythm, no murmurs or gallops Breast:Examined in sitting and supine position were symmetrical in appearance, no palpable masses or tenderness,  no skin retraction, no nipple inversion, no nipple discharge, no skin discoloration, no axillary or supraclavicular lymphadenopathy Abdomen: no palpable masses or tenderness, no rebound or guarding Extremities: no edema or skin discoloration or tenderness  Pelvic: Vulva: Normal             Vagina: No gross lesions or discharge.  Pap reflex done.  Cervix/Uterus absent  Adnexa  Without masses or tenderness  Anus: Normal   Assessment/Plan:  46 y.o. female for annual exam   1. Encounter for Papanicolaou smear of vagina  as part of routine gynecological examination S/P LAVH/Bilateral Salpingectomy 09/2019. 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  Colpo 08/2020 not reaching dysplasia.  Pap Neg 07/2021.  Pap reflex today.  No pelvic pain.  Not currently sexually active.  Urine normal. BMs normal.  Breasts normal.  Screening mammo done 07/3022 pending results.  BMI 28.07.  Good fitness and healthy nutrition.  Health labs with [facility].  Started Colono at age 73 because of mother with Colon Ca.  COLONOSCOPY: 03-25-21. - Cytology - PAP( [facility])  2. S/P laparoscopic assisted vaginal hysterectomy (LAVH)  3. Severe cervical [facility], [facility]   Genia Del MD, 11:49 AM  08/12/2022

## 2022-08-19 LAB — CYTOLOGY - PAP: Diagnosis: NEGATIVE

## 2022-11-08 ENCOUNTER — Encounter: Payer: Self-pay | Admitting: Obstetrics & Gynecology

## 2022-12-21 DIAGNOSIS — Z0289 Encounter for other administrative examinations: Secondary | ICD-10-CM

## 2023-06-30 ENCOUNTER — Other Ambulatory Visit: Payer: Self-pay

## 2023-06-30 ENCOUNTER — Encounter: Payer: Self-pay | Admitting: Family Medicine

## 2023-06-30 ENCOUNTER — Ambulatory Visit: Payer: Managed Care, Other (non HMO) | Admitting: Family Medicine

## 2023-06-30 VITALS — BP 136/90 | Ht 67.75 in | Wt 190.0 lb

## 2023-06-30 DIAGNOSIS — M25512 Pain in left shoulder: Secondary | ICD-10-CM

## 2023-06-30 NOTE — Progress Notes (Signed)
PCP: Isabella Bowens, PA-C  Subjective:   HPI: Patient is a 47 y.o. 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  Current pain started a few months ago.  Worse laterally and superiorly.  Pain is exacerbated by overhead movements and reaching. Denies shooting pains down arm.   Past Medical History:  Diagnosis Date   Anxiety    Cervical dysplasia    Severe   Depression    GERD (gastroesophageal reflux disease)    HPV in female    Hypothyroidism    Migraines    Seasonal allergies     Current Outpatient Medications on File Prior to Visit  Medication Sig Dispense Refill   BIOTIN PO Take by mouth.     Cholecalciferol (VITAMIN D3 PO) Take by mouth.     citalopram (CELEXA) 20 MG tablet Take 20 mg by mouth daily.   11   fluticasone (FLONASE) 50 MCG/ACT nasal spray Place 2 sprays into both nostrils daily as needed for allergies or rhinitis.     levocetirizine (XYZAL) 5 MG tablet Take 5 mg by mouth every evening.     levothyroxine (SYNTHROID) 25 MCG tablet Take 25 mcg by mouth daily before breakfast.      Multiple Vitamin (MULTIVITAMIN PO) Take by mouth.     valACYclovir (VALTREX) 1000 MG tablet Take 1 tablet (1,000 mg total) by mouth 2 (two) times daily. For 5 days (Patient taking differently: Take 1,000 mg by mouth as needed. For 5 days) 90 tablet 1   No current facility-administered medications on file prior to visit.    Past Surgical History:  Procedure Laterality Date   ABDOMINAL HYSTERECTOMY     DILATION AND CURETTAGE OF UTERUS     x2   LAPAROSCOPIC VAGINAL HYSTERECTOMY WITH SALPINGECTOMY Bilateral 10/09/2019   Procedure: LAPAROSCOPIC ASSISTED VAGINAL HYSTERECTOMY WITH SALPINGECTOMY;  Surgeon: Genia Del, MD;   Location: [facility] [place];  Service: Gynecology;  Laterality: Bilateral;  request to follow in [facility] Gyn block requests 1 1/2 hours    Allergies  Allergen Reactions   Other Anaphylaxis    Tree nuts: anaphylaxis   Sulfa Antibiotics Anaphylaxis    There were no vitals taken for this visit.      No data to display              No data to display              Objective:  Physical Exam:  Gen: NAD, comfortable in exam room  L shoulder: Inspection: No gross deformities, ecchymosis, swelling Palpation: Point tenderness over the [facility] joint, otherwise nontender along clavicle and scapula ROM: Abduction limited to about 70 degrees due to pain actively, lacks about 20 degrees passively.  Passive and active external rotation to 45 degrees due to stiffness.  Otherwise normal ROM Strength: 5-/5 strength with abduction, otherwise 5/5 strength Special tests: Positive Hawkins, positive Neer's, negative empty can Normal sensation and perfusion throughout arm  Complete MSK u/s left shoulder: Biceps tendon: intact on short and long views without tenosynovitis Pec major tendon: intact Subscapularis: intact without abnormalities AC joint: mod arthropathy with effusion - minimal pain with sonopalpation Infraspinatus: intact with mild tendinopathic changes Supraspinatus: small partial thickness interstitial tear anteriorly.  Otherwise normal.  No bursitis Posterior glenohumeral joint: no effusion,  posterior labral tear, arthropathy.  Impression: small partal interstitial supraspinatus tear.  AC arthropathy with effusion.  Assessment & Plan:   1.  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  Glenohumeral joint injection performed today.  Otherwise continue NSAIDs as needed and home exercises.  Follow-up in 6 weeks  L glenohumeral joint injection After informed written consent  timeout was performed, patient was seated in chair in exam room. Left shoulder was prepped with alcohol swabs and utilizing ultrasound guidance, patient's left glenohumeral space was injected with 3:1 lidocaine: depomedrol. Patient tolerated the procedure well without immediate complications.

## 2023-06-30 NOTE — Patient Instructions (Signed)
You have a very small rotator cuff tear (supraspinatus) and a mild frozen shoulder. You were given an injection today into the joint. Do home exercises daily on days you don't go to therapy. Aleve or ibuprofen only if needed. Continue working with [place]. Follow up with me in 6 weeks for reevaluation.

## 2023-07-01 ENCOUNTER — Encounter: Payer: Self-pay | Admitting: Family Medicine

## 2023-07-01 MED ORDER — METHYLPREDNISOLONE ACETATE 40 MG/ML IJ SUSP
40.0000 mg | Freq: Once | INTRAMUSCULAR | Status: AC
  Administered 2023-06-30: 40 mg via INTRA_ARTICULAR

## 2023-07-01 NOTE — Addendum Note (Signed)
Addended by: Merrilyn Puma on: 07/01/2023 11:31 AM   Modules accepted: Orders
